# Patient Record
Sex: Female | Born: 1990
Health system: Southern US, Community
[De-identification: ages and names within clinical notes are randomized; demographics above are authoritative.]

## PROBLEM LIST (undated history)

## (undated) DIAGNOSIS — N83201 Unspecified ovarian cyst, right side: Secondary | ICD-10-CM

## (undated) DIAGNOSIS — I7300 Raynaud's syndrome without gangrene: Secondary | ICD-10-CM

## (undated) DIAGNOSIS — U099 Post covid-19 condition, unspecified: Secondary | ICD-10-CM

## (undated) DIAGNOSIS — J309 Allergic rhinitis, unspecified: Secondary | ICD-10-CM

## (undated) DIAGNOSIS — N83202 Unspecified ovarian cyst, left side: Secondary | ICD-10-CM

## (undated) DIAGNOSIS — E785 Hyperlipidemia, unspecified: Secondary | ICD-10-CM

## (undated) HISTORY — PX: LASER ABLATION OF VASCULAR LESION: SHX1950

## (undated) HISTORY — PX: ABLATION SAPHENOUS VEIN W/ RFA: SUR11

## (undated) HISTORY — PX: ANGIOMA CAUTERY: SHX1141

## (undated) HISTORY — DX: Post covid-19 condition, unspecified: U09.9

## (undated) HISTORY — DX: Allergic rhinitis, unspecified: J30.9

## (undated) HISTORY — DX: Hyperlipidemia, unspecified: E78.5

## (undated) HISTORY — PX: BREAST BIOPSY: SHX20

## (undated) HISTORY — DX: Raynaud's syndrome without gangrene: I73.00

---

## 2009-01-01 ENCOUNTER — Encounter: Admission: RE | Admit: 2009-01-01 | Discharge: 2009-01-01 | Payer: Self-pay | Admitting: Family Medicine

## 2009-09-29 ENCOUNTER — Emergency Department (HOSPITAL_COMMUNITY): Admission: EM | Admit: 2009-09-29 | Discharge: 2009-09-29 | Payer: Self-pay | Admitting: Emergency Medicine

## 2009-11-08 ENCOUNTER — Emergency Department (HOSPITAL_COMMUNITY): Admission: EM | Admit: 2009-11-08 | Discharge: 2009-11-09 | Payer: Self-pay | Admitting: Emergency Medicine

## 2010-05-13 ENCOUNTER — Emergency Department (HOSPITAL_COMMUNITY)
Admission: EM | Admit: 2010-05-13 | Discharge: 2010-05-13 | Payer: Self-pay | Source: Home / Self Care | Admitting: Emergency Medicine

## 2010-05-14 ENCOUNTER — Encounter: Payer: Self-pay | Admitting: Sports Medicine

## 2010-05-15 ENCOUNTER — Emergency Department (HOSPITAL_COMMUNITY)
Admission: EM | Admit: 2010-05-15 | Discharge: 2010-05-15 | Payer: Self-pay | Source: Home / Self Care | Admitting: Emergency Medicine

## 2010-07-01 ENCOUNTER — Ambulatory Visit: Payer: Self-pay | Admitting: Sports Medicine

## 2010-07-01 DIAGNOSIS — M7750 Other enthesopathy of unspecified foot: Secondary | ICD-10-CM | POA: Insufficient documentation

## 2010-07-01 DIAGNOSIS — M25579 Pain in unspecified ankle and joints of unspecified foot: Secondary | ICD-10-CM | POA: Insufficient documentation

## 2010-07-29 ENCOUNTER — Ambulatory Visit: Payer: Self-pay | Admitting: Sports Medicine

## 2010-09-21 ENCOUNTER — Ambulatory Visit
Admission: RE | Admit: 2010-09-21 | Discharge: 2010-09-21 | Payer: Self-pay | Source: Home / Self Care | Attending: Sports Medicine | Admitting: Sports Medicine

## 2010-09-28 NOTE — Assessment & Plan Note (Signed)
Summary: NP,U/S PERONEAL TENDON,MC   Vital Signs:  Patient profile:   20 year old female Height:      58 inches Weight:      110 pounds BMI:     23.07 BP sitting:   115 / 85  Vitals Entered By: Rochele Pages RN (July 01, 2010 3:10 PM)  History of Present Illness: Pt presents today as a referral from Dr. Margaretha Sheffield with [facility].  She is here today for further assesment of her R ankle.  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  She did not have pain later that night or the next day but did start noticing the pain the 2nd day after her injury.  She did not report any trauma and has never had prior ankle or knee injuries.  The pain is aching and tight, with occasional foot numbness and tingling located mainly on the dorsal lateral aspect of the foot.    She has recieved Mobic and ankle strengthing/stretching exercises along with a lace up ankle brace but has not had complete resolution of her pain.  She uses ice on occasion when her pain is severe.  Any extended periods of time on her feet or walking worsens her pain and swelling.    Physical Exam  General:  Well-developed,well-nourished,in no acute distress; alert,appropriate and cooperative throughout examination Msk:  Foot/Ankle: Pain with eversion of R foot.  Strength 5+/5 diffusely in B feet other than with R eversion limited by pain.  ROM equal B and WNL.  Sensation in tact to light touch.    Swelling around R posterior lateral malleolus.  TTP along R peroneal muscles.  Decreased subtalar motion R>L; moderate longitudinal arch with R collapse during wt bearing.     U/S Findings: Tear in the peroneous brevis tendon just proximal to the lateral malleolus.  Peroneal retinaculum intact.  This is a mid substance split Peroneus longus interposes at one point to give a thickness of 0.58 (2x norm) mild edema Extremities:   No clubbing, cyanosis, or edema.  She does have mild swelling posterior to the right lateral malleolus.     Impression & Recommendations:  Problem # 1:  ANKLE PAIN, RIGHT (ICD-719.47)  This is chronic w no sign of ligament instabiity TTP primarily over peroneal tendons  Orders: [place] LIMITED ([id number])  Problem # 2:  OTHER ENTHESOPATHY OF ANKLE AND TARSUS (ICD-726.79)  will begin NTG protocol  we will  order a small size ankle sport support that will  keep this area stabilized  susepct will  take 12 wks to heal  reck 1 mo  Orders: [place] LIMITED ([id number])  Complete Medication List: 1)  Nitroglycerin 0.2 Mg/hr Pt24 (Nitroglycerin) .... Apply 1/4 patch to affected area daily. change patch daily. Prescriptions: NITROGLYCERIN 0.2 MG/HR PT24 (NITROGLYCERIN) Apply 1/4 patch to affected area daily. Change patch daily.  #30 x 1   Entered by:   Rochele Pages RN   Authorized by:   Enid Baas MD   Signed by:   Rochele Pages RN on 07/01/2010   Method used:   Electronically to        [id number] (retail)       [address]       [place], [place]  [address]       Ph: [phone] or [phone]       Fax: [phone]  RxID:   [id number]    Orders Added: 1)  New Patient Level III [99203] 2)  [place] LIMITED [[id number]]  Appended Document: NP,U/S PERONEAL TENDON,MC Pt picked up airsport brace 07/08/10. Rochele Pages RN  July 09, 2010 8:45 AM

## 2010-09-28 NOTE — Consult Note (Signed)
Summary: [facility]  [facility]   Imported By: Marily Memos 05/17/2010 11:53:07  _____________________________________________________________________  External Attachment:    Type:   Image     Comment:   External Document

## 2010-09-28 NOTE — Assessment & Plan Note (Signed)
Summary: f/u,mc   Vital Signs:  Patient profile:   20 year old female BP sitting:   106 / 67  Vitals Entered By: Lillia Pauls CMA (July 29, 2010 3:43 PM)  History of Present Illness: 66 YOF w/ PMHx/o peroneal tendon rupture here for followup visit. Pt was originally referred by Dr. Margaretha Sheffield for R ankle pain. Pt was noted to have tear in the peroneous brevis tendon just proximal to the lateral malleolus on most recent ultrasound 11/311. Pt was prescribed air sport brace for ankle support as well as NTG patch. Pt reports marked improvment in pain. Especially with ankle brace. Pt also feels that ROM is also improved. Pain noted in the dorsal/lateral aspect of the foot on previous visit has also improved.   Pt now complains of pain on medial aspect of foot. Pt feels that arches may be collapsing. Has been told in the past that she had collapsing arches/flat feet. Sometimes has pain w/ foot eversion bilaterally though R>L.   Physical Exam  General:  alert and well-developed.   Msk:  Foot/Ankle: Pain with eversion of R foot.  Strength 5+/5 diffusely in B feet other than with R eversion limited by pain.  ROM equal B and WNL.  Sensation in tact to light touch.   Minimal swelling noted along lateral malleolus   noted pronation w/ standing as well as internal arch collapse + pronation w/ walking noted on exam   U/S Findings: Improved tear in the peroneous brevis tendon just inferior to the lateral malleolus.  Peroneal retinaculum intact.  This is a mid substance split Peroneus longus interposes at one point to give a thickness of 0.55  mild edema persists   Impression & Recommendations:  Problem # 1:  OTHER ENTHESOPATHY OF ANKLE AND TARSUS (ICD-726.79) Peroneal tear much improved on ultrasound. Plan to continue w/ NTG and ankle bracing. Will followup in 6-8 weeks. Will also give greensoles and scaphoid insert for arch support.  Orders: Sports Insoles [phone]) Foot Orthosis ( Arch  Strap/Heel Cup) [phone]) [place] LIMITED [phone])  Complete Medication List: 1)  Nitroglycerin 0.2 Mg/hr Pt24 (Nitroglycerin) .... Apply 1/4 patch to affected area daily. change patch daily.   Orders Added: 1)  Sports Insoles [L3510] 2)  Foot Orthosis ( Arch Strap/Heel Cup) [H8469] 3)  Est. Patient Level III [62952] 4)  [place] LIMITED [[id number]]

## 2010-09-30 NOTE — Assessment & Plan Note (Signed)
Summary: FU ANKLE/MJD   Vital Signs:  Patient profile:   20 year old female Pulse rate:   77 / minute BP sitting:   115 / 68  (right arm)  Vitals Entered By: Rochele Pages RN (September 21, 2010 11:52 AM) CC: f/ R ankle   CC:  f/ R ankle.  History of Present Illness: Pt here to f/u on Rt peroneal tendon split.  Pt was noted to have tear in the peroneous brevis tendon just proximal to the lateral malleolus that was much improved on last u/s 07/29/10. Pt has been using air sport brace ankle brace, NTG patch and sports insoles which have been helpful.  Pt states her pain is greatly improved.   Preventive Screening-Counseling & Management  Alcohol-Tobacco     Smoking Status: never  Social History: Smoking Status:  never  Physical Exam  General:  Well-developed,well-nourished,in no acute distress; alert,appropriate and cooperative throughout examination Msk:  today no difference in swelling on left or rt ankle resisted eversion on RT is now non painful ankle shows no swelling; stable lateral and medial ligaments; squeeze test and kleiger test unremarkable; talar dome seems nontender; no sign of peroneal tendon subluxations; no pain at base of 5th MT.  Additional Exam:  MSK [place]  The peroneals have decreased to 0.44 thick versus 0.58 at start of treatment there is no abnormal fluid in sheath now the area of split noted before shows some calcification but split not observed today   Impression & Recommendations:  Problem # 1:  ANKLE PAIN, RIGHT (ICD-719.47) Assessment Improved  this seems minor now  will let her use NTG for 2 more weeks and then try every other day if no increase in pain will wean off by 6 wks  Orders: [place] LIMITED ([id number])  Problem # 2:  OTHER ENTHESOPATHY OF ANKLE AND TARSUS (ICD-726.79)  definitely healing  will now add some theraband exercises and try to regain strength  reck 6 wks  Orders: [place] LIMITED ([id number])  Complete Medication List: 1)   Nitroglycerin 0.2 Mg/hr Pt24 (Nitroglycerin) .... Apply 1/4 patch to affected area daily. change patch daily.  Patient Instructions: 1)  start using theraband to do some ankle strengthening exercises 2)  Continue using nitroglycerin patch daily for the next 2 weeks, then start using patch every other day 3)  return to clinic for follow up in 6 weeks   Orders Added: 1)  Est. Patient Level III [27253] 2)  [place] LIMITED [[id number]]

## 2010-10-26 ENCOUNTER — Encounter: Payer: Self-pay | Admitting: Sports Medicine

## 2010-10-26 ENCOUNTER — Ambulatory Visit (INDEPENDENT_AMBULATORY_CARE_PROVIDER_SITE_OTHER): Payer: BC Managed Care – PPO | Admitting: Sports Medicine

## 2010-10-26 DIAGNOSIS — M25579 Pain in unspecified ankle and joints of unspecified foot: Secondary | ICD-10-CM

## 2010-10-26 DIAGNOSIS — M7750 Other enthesopathy of unspecified foot: Secondary | ICD-10-CM

## 2010-11-04 NOTE — Assessment & Plan Note (Signed)
Summary: FU ANKLE   Vital Signs:  Patient profile:   20 year old female BP sitting:   119 / 75  Vitals Entered By: Lillia Pauls CMA (October 26, 2010 12:12 PM)  History of Present Illness: Patient comes in followup of RT peroneal tendon tear and chronic tendinopathy since starting NTG she feels that she has been able to do a lot more pain is now 85% resolved able to walk on toes, dance, has sone a little easy running none of these caused any real pain or swelling  swelling has been absent for past 3 weeks in past month she has been weaning NTG patchers to q3 days or so  Physical Exam  General:  Well-developed,well-nourished,in no acute distress; alert,appropriate and cooperative throughout examination Msk:  RT ankle shows no swelling; stable lateral and medial ligaments; squeeze test and kleiger test unremarkable; talar dome seems nontender; no sign of peroneal tendon subluxations; no pain at base of 5th MT.  full testing of peroneals reveals no pain or weakness  Additional Exam:  MSK [place] The tendons continue to normalize width is only 0.38 now - which is normal no tears visualized no fluid around sheath   Impression & Recommendations:  Problem # 1:  OTHER ENTHESOPATHY OF ANKLE AND TARSUS (ICD-726.79) This is clinically and on scanning normal to exam and appearance  can stop NTG  work motion exercises and gait  to strengthen tendons  reck prn  Problem # 2:  ANKLE PAIN, RIGHT (ICD-719.47) now 85% gone use OTC meds if needed otherwise OK to go without brace etc  Complete Medication List: 1)  Nitroglycerin 0.2 Mg/hr Pt24 (Nitroglycerin) .... Apply 1/4 patch to affected area daily. change patch daily.   Orders Added: 1)  Est. Patient Level III [04540]

## 2010-11-11 LAB — URINALYSIS, ROUTINE W REFLEX MICROSCOPIC
Leukocytes, UA: NEGATIVE
Nitrite: NEGATIVE
Specific Gravity, Urine: 1.017 (ref 1.005–1.030)
Specific Gravity, Urine: 1.017 (ref 1.005–1.030)
Urobilinogen, UA: 0.2 mg/dL (ref 0.0–1.0)
pH: 7.5 (ref 5.0–8.0)

## 2010-11-11 LAB — CBC
MCV: 94.2 fL (ref 78.0–100.0)
Platelets: 230 10*3/uL (ref 150–400)
RDW: 13 % (ref 11.5–15.5)
WBC: 7.2 10*3/uL (ref 4.0–10.5)

## 2010-11-11 LAB — URINE CULTURE
Colony Count: NO GROWTH
Culture  Setup Time: 201109150838
Culture: NO GROWTH

## 2010-11-11 LAB — POCT I-STAT, CHEM 8
Chloride: 105 mEq/L (ref 96–112)
Creatinine, Ser: 0.5 mg/dL (ref 0.4–1.2)
Glucose, Bld: 97 mg/dL (ref 70–99)
HCT: 44 % (ref 36.0–46.0)
Hemoglobin: 15 g/dL (ref 12.0–15.0)
Sodium: 140 mEq/L (ref 135–145)

## 2010-11-11 LAB — DIFFERENTIAL
Basophils Absolute: 0 10*3/uL (ref 0.0–0.1)
Basophils Relative: 0 % (ref 0–1)
Lymphocytes Relative: 32 % (ref 12–46)
Lymphs Abs: 2.3 10*3/uL (ref 0.7–4.0)
Monocytes Absolute: 0.6 10*3/uL (ref 0.1–1.0)
Neutro Abs: 4.2 10*3/uL (ref 1.7–7.7)

## 2010-11-11 LAB — URINE MICROSCOPIC-ADD ON

## 2010-11-11 LAB — WET PREP, GENITAL
Trich, Wet Prep: NONE SEEN
Yeast Wet Prep HPF POC: NONE SEEN

## 2010-11-11 LAB — GC/CHLAMYDIA PROBE AMP, GENITAL
Chlamydia, DNA Probe: NEGATIVE
GC Probe Amp, Genital: NEGATIVE

## 2010-11-18 LAB — POCT PREGNANCY, URINE: Preg Test, Ur: NEGATIVE

## 2010-11-22 LAB — DIFFERENTIAL
Basophils Absolute: 0 10*3/uL (ref 0.0–0.1)
Eosinophils Absolute: 0 10*3/uL (ref 0.0–0.7)
Eosinophils Relative: 0 % (ref 0–5)
Monocytes Absolute: 0.3 10*3/uL (ref 0.1–1.0)
Neutro Abs: 9.6 10*3/uL — ABNORMAL HIGH (ref 1.7–7.7)

## 2010-11-22 LAB — POCT I-STAT, CHEM 8
BUN: 9 mg/dL (ref 6–23)
HCT: 41 % (ref 36.0–46.0)
Hemoglobin: 13.9 g/dL (ref 12.0–15.0)
Potassium: 3.2 mEq/L — ABNORMAL LOW (ref 3.5–5.1)
Sodium: 137 mEq/L (ref 135–145)
TCO2: 21 mmol/L (ref 0–100)

## 2010-11-22 LAB — URINALYSIS, ROUTINE W REFLEX MICROSCOPIC
Bilirubin Urine: NEGATIVE
Ketones, ur: >80 mg/dL — AB
Protein, ur: NEGATIVE mg/dL
pH: 8 (ref 5.0–8.0)

## 2010-11-22 LAB — CBC
Hemoglobin: 13.4 g/dL (ref 12.0–15.0)
MCHC: 32.9 g/dL (ref 30.0–36.0)
Platelets: 176 10*3/uL (ref 150–400)
RBC: 4.25 MIL/uL (ref 3.87–5.11)
RDW: 12.9 % (ref 11.5–15.5)

## 2010-11-22 LAB — URINE MICROSCOPIC-ADD ON

## 2012-01-31 ENCOUNTER — Ambulatory Visit (INDEPENDENT_AMBULATORY_CARE_PROVIDER_SITE_OTHER): Payer: BC Managed Care – PPO | Admitting: Sports Medicine

## 2012-01-31 ENCOUNTER — Encounter: Payer: Self-pay | Admitting: Sports Medicine

## 2012-01-31 VITALS — BP 116/82 | HR 79 | Ht 58.5 in | Wt 129.0 lb

## 2012-01-31 DIAGNOSIS — M25569 Pain in unspecified knee: Secondary | ICD-10-CM

## 2012-01-31 DIAGNOSIS — M2391 Unspecified internal derangement of right knee: Secondary | ICD-10-CM | POA: Insufficient documentation

## 2012-01-31 NOTE — Progress Notes (Signed)
  Subjective:    Patient ID: Savannah Beck, female    DOB: 10-03-1990, 21 y.o.   MRN: [id number]  HPI Right knee pain: X 2 months. Pain in lateral and medial knee- occurs after activity- walking, dance, etc.  Motrin and nitropatch have improved pain but has not resolved pain.  Occasional slight swelling in knee- no redness.  Feels weak- like it might "give out".  No popping or clicking.  Has h/o right peroneal tear in 07/2010. No known trauma to area.  Normal sensation.  Normal strength.   Knee pain starts on the outside near the fibular head and radiates down her peroneal muscles. This is worse with prolonged standing. She cannot date a specific injury.   Review of Systems As per above.     Objective:   Physical Exam  Constitutional: She appears well-developed and well-nourished. No distress.  Cardiovascular: Normal rate.   Pulmonary/Chest: Effort normal. No respiratory distress.  Musculoskeletal:       Right knee exam: No redness or swelling or joint effusion. ROM WNL.   Sensation normal in legs bilateral. Strength 5/5 bilateral in lower extremities- but weakness in right hip abductors compared to left.  Reflexes normal bilateral.  Mild tenderness with palpation of medial and lateral tibial plateau.   Knee ligaments stable- with end points equal bilateral- some mild hypermobility in knee ligaments bilateral.  Abnormal medial directed movement of right patella with ambulation. + pronation of feet on examination of gait.    thessaly knee test negative.           Assessment & Plan:

## 2012-01-31 NOTE — Assessment & Plan Note (Signed)
Gave pt bodyhelix knee brace to use with activity and as needed.  Gave small scaphoid pad inserts for shoes to help decrease foot pronation.  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

## 2014-01-13 ENCOUNTER — Emergency Department (HOSPITAL_COMMUNITY)
Admission: EM | Admit: 2014-01-13 | Discharge: 2014-01-13 | Disposition: A | Discharge status: Home / Self Care | Payer: BC Managed Care – PPO | Attending: Emergency Medicine | Admitting: Emergency Medicine

## 2014-01-13 ENCOUNTER — Encounter (HOSPITAL_COMMUNITY): Payer: Self-pay | Admitting: Emergency Medicine

## 2014-01-13 DIAGNOSIS — M7989 Other specified soft tissue disorders: Secondary | ICD-10-CM

## 2014-01-13 DIAGNOSIS — Z79899 Other long term (current) drug therapy: Secondary | ICD-10-CM | POA: Insufficient documentation

## 2014-01-13 DIAGNOSIS — Z880 Allergy status to penicillin: Secondary | ICD-10-CM | POA: Insufficient documentation

## 2014-01-13 LAB — D-DIMER, QUANTITATIVE: D-Dimer, Quant: <0.27 ug/mL-FEU (ref 0.00–0.48)

## 2014-01-13 MED ORDER — IBUPROFEN 600 MG PO TABS
600.0000 mg | ORAL_TABLET | Freq: Four times a day (QID) | ORAL | Status: DC | PRN
Start: 2014-01-13 — End: 2016-09-20

## 2014-01-13 MED ORDER — IBUPROFEN 200 MG PO TABS
600.0000 mg | ORAL_TABLET | Freq: Once | ORAL | Status: AC
  Administered 2014-01-13: 600 mg via ORAL
  Filled 2014-01-13: qty 3

## 2014-01-13 NOTE — ED Notes (Signed)
Pt states Friday her entire left leg was swollen and she had it massaged til the swelling went down  Saturday it was sore but felt better  Sunday she had muscle cramping but was on her feet all day  This woke up with pain shooting up her leg causing her to be unable to sleep and has swelling again  Pt states it was a pinching burning type of pain

## 2014-01-13 NOTE — ED Provider Notes (Signed)
CSN: [id number]     Arrival date & time 01/13/14  0524 History   First MD Initiated Contact with Patient 01/13/14 0559     Chief Complaint  Patient presents with  . Leg Pain     (Consider location/radiation/quality/duration/timing/severity/associated sxs/prior Treatment) HPI  This is a 24 are old female with no significant past medical history who presents with left leg swelling and pain. Patient reports onset of symptoms on Friday. She states that she was on her feet all day on Friday and noted left greater than right leg swelling. She also reports cramping that has been on off all weekend. She states that she was unable to sleep and that is why she came in. No history of recent surgery, hospitalization, or blood clots.  She denies any chest pain or shortness of breath. She denies any skin changes. No fevers.  History reviewed. No pertinent past medical history. History reviewed. No pertinent past surgical history. Family History  Problem Relation Age of Onset  . Thyroid disease Mother   . Diabetes Mother   . Cancer Father   . Diabetes Other   . CAD Other   . Alzheimer's disease Other    History  Substance Use Topics  . Smoking status: Never Smoker   . Smokeless tobacco: Never Used  . Alcohol Use: Yes     Comment: once a month   OB History   Grav Para Term Preterm Abortions TAB SAB Ect Mult Living                 Review of Systems  Constitutional: Negative for fever.  Respiratory: Negative for chest tightness and shortness of breath.   Cardiovascular: Positive for leg swelling. Negative for chest pain.  All other systems reviewed and are negative.     Allergies  Penicillins  Home Medications   Prior to Admission medications   Medication Sig Start Date End Date Taking? Authorizing Provider  TRI-PREVIFEM 0.18/0.215/0.25 MG-35 MCG tablet Take 1 tablet by mouth daily. 01/14/12  Yes Historical Provider, MD  ibuprofen (ADVIL,MOTRIN) 600 MG tablet Take 1 tablet (600 mg  total) by mouth every 6 (six) hours as needed. 01/13/14   Merryl Hacker, MD   BP 120/70  Pulse 63  Temp(Src) 99 F (37.2 C) (Oral)  Resp 16  Ht 4\' 10"  (1.473 m)  Wt 140 lb (63.504 kg)  BMI 29.27 kg/m2  SpO2 100%  LMP 12/23/2013 Physical Exam  Nursing note and vitals reviewed. Constitutional: She is oriented to person, place, and time. She appears well-developed and well-nourished.  HENT:  Head: Normocephalic and atraumatic.  Cardiovascular: Normal rate, regular rhythm and normal heart sounds.   No murmur heard. Pulmonary/Chest: Effort normal and breath sounds normal. No respiratory distress. She has no wheezes.  Musculoskeletal:  No appreciable edema of the left lower extremity, no overlying skin changes, no tenderness to palpation, negative Homans sign  Neurological: She is alert and oriented to person, place, and time.  Skin: Skin is warm and dry.  Psychiatric: She has a normal mood and affect.    ED Course  Procedures (including critical care time) Labs Review Labs Reviewed  D-DIMER, QUANTITATIVE    Imaging Review No results found.   EKG Interpretation None      MDM   Final diagnoses:  Leg swelling    Patient presents with leg swelling and pain. No known injury. Patient is on birth control so D-dimer screening for DVT. Exam is reassuring. D-dimer is negative. Discuss with  patient ibuprofen for pain management. Also encouraged patient to drink electrolyte rich fluids. Patient stated understanding.  After history, exam, and medical workup I feel the patient has been appropriately medically screened and is safe for discharge home. Pertinent diagnoses were discussed with the patient. Patient was given return precautions.     Merryl Hacker, MD 01/13/14 [phone]

## 2014-01-13 NOTE — Discharge Instructions (Signed)
Edema Edema is an abnormal build-up of fluids in tissues. Because this is partly dependent on gravity (water flows to the lowest place), it is more common in the legs and thighs (lower extremities). It is also common in the looser tissues, like around the eyes. Painless swelling of the feet and ankles is common and increases as a person ages. It may affect both legs and may include the calves or even thighs. When squeezed, the fluid may move out of the affected area and may leave a dent for a few moments. CAUSES   Prolonged standing or sitting in one place for extended periods of time. Movement helps pump tissue fluid into the veins, and absence of movement prevents this, resulting in edema.  Varicose veins. The valves in the veins do not work as well as they should. This causes fluid to leak into the tissues.  Fluid and salt overload.  Injury, burn, or surgery to the leg, ankle, or foot, may damage veins and allow fluid to leak out.  Sunburn damages vessels. Leaky vessels allow fluid to go out into the sunburned tissues.  Allergies (from insect bites or stings, medications or chemicals) cause swelling by allowing vessels to become leaky.  Protein in the blood helps keep fluid in your vessels. Low protein, as in malnutrition, allows fluid to leak out.  Hormonal changes, including pregnancy and menstruation, cause fluid retention. This fluid may leak out of vessels and cause edema.  Medications that cause fluid retention. Examples are sex hormones, blood pressure medications, steroid treatment, or anti-depressants.  Some illnesses cause edema, especially heart failure, kidney disease, or liver disease.  Surgery that cuts veins or lymph nodes, such as surgery done for the heart or for breast cancer, may result in edema. DIAGNOSIS  Your caregiver is usually easily able to determine what is causing your swelling (edema) by simply asking what is wrong (getting a history) and examining you (doing  a physical). Sometimes x-rays, EKG (electrocardiogram or heart tracing), and blood work may be done to evaluate for underlying medical illness. TREATMENT  General treatment includes:  Leg elevation (or elevation of the affected body part).  Restriction of fluid intake.  Prevention of fluid overload.  Compression of the affected body part. Compression with elastic bandages or support stockings squeezes the tissues, preventing fluid from entering and forcing it back into the blood vessels.  Diuretics (also called water pills or fluid pills) pull fluid out of your body in the form of increased urination. These are effective in reducing the swelling, but can have side effects and must be used only under your caregiver's supervision. Diuretics are appropriate only for some types of edema. The specific treatment can be directed at any underlying causes discovered. Heart, liver, or kidney disease should be treated appropriately. HOME CARE INSTRUCTIONS   Elevate the legs (or affected body part) above the level of the heart, while lying down.  Avoid sitting or standing still for prolonged periods of time.  Avoid putting anything directly under the knees when lying down, and do not wear constricting clothing or garters on the upper legs.  Exercising the legs causes the fluid to work back into the veins and lymphatic channels. This may help the swelling go down.  The pressure applied by elastic bandages or support stockings can help reduce ankle swelling.  A low-salt diet may help reduce fluid retention and decrease the ankle swelling.  Take any medications exactly as prescribed. SEEK MEDICAL CARE IF:  Your edema is   not responding to recommended treatments. SEEK IMMEDIATE MEDICAL CARE IF:   You develop shortness of breath or chest pain.  You cannot breathe when you lay down; or if, while lying down, you have to get up and go to the window to get your breath.  You are having increasing  swelling without relief from treatment.  You develop a fever over 102 F (38.9 C).  You develop pain or redness in the areas that are swollen.  Tell your caregiver right away if you have gained 03 lb/1.4 kg in 1 day or 05 lb/2.3 kg in a week. MAKE SURE YOU:   Understand these instructions.  Will watch your condition.  Will get help right away if you are not doing well or get worse. Document Released: 08/15/2005 Document Revised: 02/14/2012 Document Reviewed: 04/02/2008 [employer] Patient Information 2014 [employer].  

## 2014-10-12 ENCOUNTER — Emergency Department (HOSPITAL_COMMUNITY): Payer: BLUE CROSS/BLUE SHIELD

## 2014-10-12 ENCOUNTER — Encounter (HOSPITAL_COMMUNITY): Payer: Self-pay | Admitting: Emergency Medicine

## 2014-10-12 ENCOUNTER — Emergency Department (HOSPITAL_COMMUNITY)
Admission: EM | Admit: 2014-10-12 | Discharge: 2014-10-12 | Disposition: A | Discharge status: Home / Self Care | Payer: BLUE CROSS/BLUE SHIELD | Attending: Emergency Medicine | Admitting: Emergency Medicine

## 2014-10-12 DIAGNOSIS — Z7951 Long term (current) use of inhaled steroids: Secondary | ICD-10-CM | POA: Insufficient documentation

## 2014-10-12 DIAGNOSIS — Z3202 Encounter for pregnancy test, result negative: Secondary | ICD-10-CM | POA: Insufficient documentation

## 2014-10-12 DIAGNOSIS — R102 Pelvic and perineal pain unspecified side: Secondary | ICD-10-CM

## 2014-10-12 DIAGNOSIS — Z8742 Personal history of other diseases of the female genital tract: Secondary | ICD-10-CM | POA: Diagnosis not present

## 2014-10-12 DIAGNOSIS — Z880 Allergy status to penicillin: Secondary | ICD-10-CM | POA: Diagnosis not present

## 2014-10-12 DIAGNOSIS — R1011 Right upper quadrant pain: Secondary | ICD-10-CM

## 2014-10-12 DIAGNOSIS — N390 Urinary tract infection, site not specified: Secondary | ICD-10-CM | POA: Diagnosis not present

## 2014-10-12 DIAGNOSIS — R109 Unspecified abdominal pain: Secondary | ICD-10-CM | POA: Diagnosis present

## 2014-10-12 DIAGNOSIS — Z79899 Other long term (current) drug therapy: Secondary | ICD-10-CM | POA: Insufficient documentation

## 2014-10-12 HISTORY — DX: Unspecified ovarian cyst, right side: N83.201

## 2014-10-12 HISTORY — DX: Unspecified ovarian cyst, left side: N83.202

## 2014-10-12 LAB — COMPREHENSIVE METABOLIC PANEL
ALBUMIN: 4 g/dL (ref 3.5–5.2)
ALT: 17 U/L (ref 0–35)
AST: 20 U/L (ref 0–37)
Alkaline Phosphatase: 48 U/L (ref 39–117)
Anion gap: 6 (ref 5–15)
BILIRUBIN TOTAL: 0.5 mg/dL (ref 0.3–1.2)
BUN: 8 mg/dL (ref 6–23)
CO2: 23 mmol/L (ref 19–32)
Calcium: 8.7 mg/dL (ref 8.4–10.5)
Chloride: 111 mmol/L (ref 96–112)
Creatinine, Ser: 0.69 mg/dL (ref 0.50–1.10)
GFR calc Af Amer: >90 mL/min (ref >90)
Glucose, Bld: 103 mg/dL — ABNORMAL HIGH (ref 70–99)
POTASSIUM: 3.4 mmol/L — AB (ref 3.5–5.1)
Sodium: 140 mmol/L (ref 135–145)
Total Protein: 6.8 g/dL (ref 6.0–8.3)

## 2014-10-12 LAB — URINALYSIS, ROUTINE W REFLEX MICROSCOPIC
Bilirubin Urine: NEGATIVE
GLUCOSE, UA: NEGATIVE mg/dL
Hgb urine dipstick: NEGATIVE
Ketones, ur: NEGATIVE mg/dL
Nitrite: NEGATIVE
Protein, ur: NEGATIVE mg/dL
SPECIFIC GRAVITY, URINE: 1.004 — AB (ref 1.005–1.030)
Urobilinogen, UA: 0.2 mg/dL (ref 0.0–1.0)
pH: 7 (ref 5.0–8.0)

## 2014-10-12 LAB — CBC WITH DIFFERENTIAL/PLATELET
BASOS ABS: 0 10*3/uL (ref 0.0–0.1)
Basophils Relative: 0 % (ref 0–1)
EOS ABS: 0.1 10*3/uL (ref 0.0–0.7)
Eosinophils Relative: 1 % (ref 0–5)
HCT: 42.2 % (ref 36.0–46.0)
HEMOGLOBIN: 14.1 g/dL (ref 12.0–15.0)
Lymphocytes Relative: 26 % (ref 12–46)
Lymphs Abs: 1.5 10*3/uL (ref 0.7–4.0)
MCH: 31.1 pg (ref 26.0–34.0)
MCHC: 33.4 g/dL (ref 30.0–36.0)
MCV: 93 fL (ref 78.0–100.0)
Monocytes Absolute: 0.4 10*3/uL (ref 0.1–1.0)
Monocytes Relative: 7 % (ref 3–12)
Neutro Abs: 3.8 10*3/uL (ref 1.7–7.7)
Neutrophils Relative %: 66 % (ref 43–77)
PLATELETS: 227 10*3/uL (ref 150–400)
RBC: 4.54 MIL/uL (ref 3.87–5.11)
RDW: 12.5 % (ref 11.5–15.5)
WBC: 5.8 10*3/uL (ref 4.0–10.5)

## 2014-10-12 LAB — URINE MICROSCOPIC-ADD ON

## 2014-10-12 LAB — POC URINE PREG, ED: Preg Test, Ur: NEGATIVE

## 2014-10-12 LAB — LIPASE, BLOOD: LIPASE: 26 U/L (ref 11–59)

## 2014-10-12 LAB — PREGNANCY, URINE: Preg Test, Ur: NEGATIVE

## 2014-10-12 LAB — WET PREP, GENITAL
Clue Cells Wet Prep HPF POC: NONE SEEN
Trich, Wet Prep: NONE SEEN
YEAST WET PREP: NONE SEEN

## 2014-10-12 MED ORDER — AZITHROMYCIN 250 MG PO TABS
1000.0000 mg | ORAL_TABLET | Freq: Once | ORAL | Status: AC
  Administered 2014-10-12: 1000 mg via ORAL
  Filled 2014-10-12: qty 4

## 2014-10-12 MED ORDER — LIDOCAINE HCL (PF) 1 % IJ SOLN
INTRAMUSCULAR | Status: AC
Start: 2014-10-12 — End: 2014-10-12
  Administered 2014-10-12: 5 mL
  Filled 2014-10-12: qty 5

## 2014-10-12 MED ORDER — HYDROCODONE-ACETAMINOPHEN 5-325 MG PO TABS: 2.0000 | ORAL_TABLET | ORAL | Status: DC | PRN

## 2014-10-12 MED ORDER — FENTANYL CITRATE 0.05 MG/ML IJ SOLN
25.0000 ug | Freq: Once | INTRAMUSCULAR | Status: AC
  Administered 2014-10-12: 25 ug via INTRAVENOUS
  Filled 2014-10-12: qty 2

## 2014-10-12 MED ORDER — IOHEXOL 300 MG/ML  SOLN
100.0000 mL | Freq: Once | INTRAMUSCULAR | Status: AC | PRN
  Administered 2014-10-12: 100 mL via INTRAVENOUS

## 2014-10-12 MED ORDER — ONDANSETRON HCL 4 MG/2ML IJ SOLN
4.0000 mg | Freq: Once | INTRAMUSCULAR | Status: AC
  Administered 2014-10-12: 4 mg via INTRAVENOUS
  Filled 2014-10-12: qty 2

## 2014-10-12 MED ORDER — CEFTRIAXONE SODIUM 250 MG IJ SOLR
250.0000 mg | Freq: Once | INTRAMUSCULAR | Status: AC
  Administered 2014-10-12: 250 mg via INTRAMUSCULAR
  Filled 2014-10-12: qty 250

## 2014-10-12 MED ORDER — ONDANSETRON HCL 4 MG PO TABS: 4.0000 mg | ORAL_TABLET | Freq: Four times a day (QID) | ORAL | Status: DC

## 2014-10-12 MED ORDER — SODIUM CHLORIDE 0.9 % IV SOLN
Freq: Once | INTRAVENOUS | Status: AC
  Administered 2014-10-12: 09:00:00 via INTRAVENOUS

## 2014-10-12 MED ORDER — CEPHALEXIN 500 MG PO CAPS: 500.0000 mg | ORAL_CAPSULE | Freq: Four times a day (QID) | ORAL | Status: DC

## 2014-10-12 MED ORDER — IOHEXOL 300 MG/ML  SOLN
50.0000 mL | Freq: Once | INTRAMUSCULAR | Status: AC | PRN
  Administered 2014-10-12: 50 mL via ORAL

## 2014-10-12 MED ORDER — MORPHINE SULFATE 4 MG/ML IJ SOLN
4.0000 mg | Freq: Once | INTRAMUSCULAR | Status: AC
  Administered 2014-10-12: 4 mg via INTRAVENOUS
  Filled 2014-10-12: qty 1

## 2014-10-12 NOTE — ED Notes (Signed)
Pt undressed and ready for pelvic exam.

## 2014-10-12 NOTE — ED Provider Notes (Signed)
CSN: [id number]     Arrival date & time 10/12/14  2671 History   First MD Initiated Contact with Patient 10/12/14 [phone]     Chief Complaint  Patient presents with  . Abdominal Pain     (Consider location/radiation/quality/duration/timing/severity/associated sxs/prior Treatment) HPI Comments: Constant right-sided abdominal pain since last night worse with palpation and movement. Associated with nausea. No vomiting. No change in bowel habits. Endorses urinary frequency and burning with urination. No vaginal bleeding or discharge. Last menstrual period one month ago. History of ovarian cysts but this feels different and more sharp. No appetite. No back pain. No chest pain or shortness of breath.  The history is provided by the patient.    Past Medical History  Diagnosis Date  . Bilateral ovarian cysts    History reviewed. No pertinent past surgical history. Family History  Problem Relation Age of Onset  . Thyroid disease Mother   . Diabetes Mother   . Cancer Father   . Diabetes Other   . CAD Other   . Alzheimer's disease Other    History  Substance Use Topics  . Smoking status: Never Smoker   . Smokeless tobacco: Never Used  . Alcohol Use: Yes     Comment: once a month   OB History    No data available     Review of Systems  Constitutional: Positive for appetite change. Negative for fever and activity change.  Respiratory: Negative for cough, chest tightness and shortness of breath.   Gastrointestinal: Positive for nausea and abdominal pain. Negative for vomiting and diarrhea.  Genitourinary: Positive for frequency. Negative for dysuria, vaginal bleeding and vaginal discharge.  Musculoskeletal: Negative for myalgias, back pain and arthralgias.  Skin: Negative for rash.  Neurological: Negative for dizziness, weakness and headaches.  A complete 10 system review of systems was obtained and all systems are negative except as noted in the HPI and PMH.      Allergies   Penicillins  Home Medications   Prior to Admission medications   Medication Sig Start Date End Date Taking? Authorizing Provider  cetirizine (ZYRTEC) 10 MG tablet Take 10 mg by mouth daily.   Yes Historical Provider, MD  fluticasone (FLONASE) 50 MCG/ACT nasal spray Place 1 spray into both nostrils daily.   Yes Historical Provider, MD  TRI-PREVIFEM 0.18/0.215/0.25 MG-35 MCG tablet Take 1 tablet by mouth daily. 01/14/12  Yes Historical Provider, MD  cephALEXin (KEFLEX) 500 MG capsule Take 1 capsule (500 mg total) by mouth 4 (four) times daily. 10/12/14   Ezequiel Essex, MD  HYDROcodone-acetaminophen (NORCO/VICODIN) 5-325 MG per tablet Take 2 tablets by mouth every 4 (four) hours as needed. 10/12/14   Ezequiel Essex, MD  ibuprofen (ADVIL,MOTRIN) 600 MG tablet Take 1 tablet (600 mg total) by mouth every 6 (six) hours as needed. Patient not taking: Reported on 10/12/2014 01/13/14   Merryl Hacker, MD  ondansetron (ZOFRAN) 4 MG tablet Take 1 tablet (4 mg total) by mouth every 6 (six) hours. 10/12/14   Ezequiel Essex, MD   BP 128/79 mmHg  Pulse 81  Temp(Src) 97.4 F (36.3 C) (Oral)  Resp 14  SpO2 97%  LMP 09/27/2014 Physical Exam  Constitutional: She is oriented to person, place, and time. She appears well-developed and well-nourished. No distress.  uncomfortable  HENT:  Head: Normocephalic and atraumatic.  Mouth/Throat: Oropharynx is clear and moist. No oropharyngeal exudate.  Eyes: Conjunctivae and EOM are normal. Pupils are equal, round, and reactive to light.  Neck: Normal range of  motion. Neck supple.  No meningismus.  Cardiovascular: Normal rate, regular rhythm, normal heart sounds and intact distal pulses.   No murmur heard. Pulmonary/Chest: Effort normal and breath sounds normal. No respiratory distress.  Abdominal: Soft. There is tenderness. There is guarding. There is no rebound.  Diffuse right-sided abdominal tenderness, worse in the right upper quadrant with guarding   Genitourinary: Vaginal discharge found.  Chaperone present. Normal external genitalia. White vaginal discharge in vault. No CMT. No uterine tenderness. No adnexal tenderness  Musculoskeletal: Normal range of motion. She exhibits no edema or tenderness.  No CVAT  Neurological: She is alert and oriented to person, place, and time. No cranial nerve deficit. She exhibits normal muscle tone. Coordination normal.  No ataxia on finger to nose bilaterally. No pronator drift. 5/5 strength throughout. CN 2-12 intact. Negative Romberg. Equal grip strength. Sensation intact. Gait is normal.   Skin: Skin is warm.  Psychiatric: She has a normal mood and affect. Her behavior is normal.  Nursing note and vitals reviewed.   ED Course  Procedures (including critical care time) Labs Review Labs Reviewed  WET PREP, GENITAL - Abnormal; Notable for the following:    WBC, Wet Prep HPF POC TOO NUMEROUS TO COUNT (*)    All other components within normal limits  COMPREHENSIVE METABOLIC PANEL - Abnormal; Notable for the following:    Potassium 3.4 (*)    Glucose, Bld 103 (*)    All other components within normal limits  URINALYSIS, ROUTINE W REFLEX MICROSCOPIC - Abnormal; Notable for the following:    APPearance CLOUDY (*)    Specific Gravity, Urine 1.004 (*)    Leukocytes, UA LARGE (*)    All other components within normal limits  URINE MICROSCOPIC-ADD ON - Abnormal; Notable for the following:    Squamous Epithelial / LPF MANY (*)    All other components within normal limits  URINE CULTURE  CBC WITH DIFFERENTIAL/PLATELET  LIPASE, BLOOD  PREGNANCY, URINE  POC URINE PREG, ED  GC/CHLAMYDIA PROBE AMP ([place])    Imaging Review US Transvaginal Non-ob  10/12/2014   CLINICAL DATA:  Right pelvic pain for 12 hours.  EXAM: TRANSABDOMINAL AND TRANSVAGINAL ULTRASOUND OF PELVIS  DOPPLER ULTRASOUND OF OVARIES  TECHNIQUE: Both transabdominal and transvaginal ultrasound examinations of the pelvis were  performed. Transabdominal technique was performed for global imaging of the pelvis including uterus, ovaries, adnexal regions, and pelvic cul-de-sac.  It was necessary to proceed with endovaginal exam following the transabdominal exam to visualize the adnexal structures. Color and duplex Doppler ultrasound was utilized to evaluate blood flow to the ovaries.  COMPARISON:  CT 05/15/2010  FINDINGS: Uterus  Measurements: 8.2 x 3.2 x 4.5 cm. No fibroids or other mass visualized.  Endometrium  Thickness: 5 mm.  No focal abnormality visualized.  Right ovary  Measurements: 3.2 x 1.7 x 2.0 cm. Normal appearance/no adnexal mass.  Left ovary  Measurements: 2.9 x 1.5 x 1.9 cm. Normal appearance/no adnexal mass.  Pulsed Doppler evaluation of both ovaries demonstrates normal low-resistance arterial and venous waveforms.  Other findings  Trace free fluid in the pelvis.  IMPRESSION: No sonographic evidence to suggest ovarian torsion. The ovaries demonstrate flow and a normal sonographic grayscale appearance.  Trace pelvic free fluid.   Electronically Signed   By: Lovey Newcomer M.D.   On: 10/12/2014 12:16   US Pelvis Complete  10/12/2014   CLINICAL DATA:  Right pelvic pain for 12 hours.  EXAM: TRANSABDOMINAL AND TRANSVAGINAL ULTRASOUND OF PELVIS  DOPPLER  ULTRASOUND OF OVARIES  TECHNIQUE: Both transabdominal and transvaginal ultrasound examinations of the pelvis were performed. Transabdominal technique was performed for global imaging of the pelvis including uterus, ovaries, adnexal regions, and pelvic cul-de-sac.  It was necessary to proceed with endovaginal exam following the transabdominal exam to visualize the adnexal structures. Color and duplex Doppler ultrasound was utilized to evaluate blood flow to the ovaries.  COMPARISON:  CT 05/15/2010  FINDINGS: Uterus  Measurements: 8.2 x 3.2 x 4.5 cm. No fibroids or other mass visualized.  Endometrium  Thickness: 5 mm.  No focal abnormality visualized.  Right ovary  Measurements: 3.2  x 1.7 x 2.0 cm. Normal appearance/no adnexal mass.  Left ovary  Measurements: 2.9 x 1.5 x 1.9 cm. Normal appearance/no adnexal mass.  Pulsed Doppler evaluation of both ovaries demonstrates normal low-resistance arterial and venous waveforms.  Other findings  Trace free fluid in the pelvis.  IMPRESSION: No sonographic evidence to suggest ovarian torsion. The ovaries demonstrate flow and a normal sonographic grayscale appearance.  Trace pelvic free fluid.   Electronically Signed   By: Lovey Newcomer M.D.   On: 10/12/2014 12:16   Ct Abdomen Pelvis W Contrast  10/12/2014   CLINICAL DATA:  Right lower abdominal pain for 12 hours. Urinary frequency and nausea.  EXAM: CT ABDOMEN AND PELVIS WITH CONTRAST  TECHNIQUE: Multidetector CT imaging of the abdomen and pelvis was performed using the standard protocol following bolus administration of intravenous contrast.  CONTRAST:  56mL OMNIPAQUE IOHEXOL 300 MG/ML SOLN, 142mL OMNIPAQUE IOHEXOL 300 MG/ML SOLN  COMPARISON:  05/15/2010  FINDINGS: Lower chest: Unremarkable.  Upper abdomen: Kidneys have a normal appearance. No hydronephrosis or focal mass. No focal abnormality identified within the liver, spleen pancreas, adrenal glands. Gallbladder is present.  Gastrointestinal tract: The stomach and small bowel loops are normal in appearance. The appendix is well seen and has a normal appearance. The colonic loops are normal in wall thickness. There is a moderate stool burden.  Pelvis: The uterus is present. No adnexal mass. No free pelvic fluid.  Retroperitoneum: No retroperitoneal or mesenteric adenopathy.  Abdominal wall: Unremarkable.  Osseous structures: Unremarkable.  IMPRESSION: 1. Normal appearance of the kidneys and ureters. 2. Normal appendix. 3. No bowel obstruction.   Electronically Signed   By: Nolon Nations M.D.   On: 10/12/2014 14:30   [place] Art/ven Flow Abd Pelv Doppler  10/12/2014   CLINICAL DATA:  Right pelvic pain for 12 hours.  EXAM: TRANSABDOMINAL AND  TRANSVAGINAL ULTRASOUND OF PELVIS  DOPPLER ULTRASOUND OF OVARIES  TECHNIQUE: Both transabdominal and transvaginal ultrasound examinations of the pelvis were performed. Transabdominal technique was performed for global imaging of the pelvis including uterus, ovaries, adnexal regions, and pelvic cul-de-sac.  It was necessary to proceed with endovaginal exam following the transabdominal exam to visualize the adnexal structures. Color and duplex Doppler ultrasound was utilized to evaluate blood flow to the ovaries.  COMPARISON:  CT 05/15/2010  FINDINGS: Uterus  Measurements: 8.2 x 3.2 x 4.5 cm. No fibroids or other mass visualized.  Endometrium  Thickness: 5 mm.  No focal abnormality visualized.  Right ovary  Measurements: 3.2 x 1.7 x 2.0 cm. Normal appearance/no adnexal mass.  Left ovary  Measurements: 2.9 x 1.5 x 1.9 cm. Normal appearance/no adnexal mass.  Pulsed Doppler evaluation of both ovaries demonstrates normal low-resistance arterial and venous waveforms.  Other findings  Trace free fluid in the pelvis.  IMPRESSION: No sonographic evidence to suggest ovarian torsion. The ovaries demonstrate flow and a normal sonographic  grayscale appearance.  Trace pelvic free fluid.   Electronically Signed   By: Lovey Newcomer M.D.   On: 10/12/2014 12:16   US Abdomen Limited Ruq  10/12/2014   CLINICAL DATA:  Right upper quadrant pain for 12 hours.  EXAM: US ABDOMEN LIMITED - RIGHT UPPER QUADRANT  COMPARISON:  None.  FINDINGS: Gallbladder:  No gallstones or wall thickening visualized. No sonographic Murphy sign noted.  Common bile duct:  Diameter: 2 mm  Liver:  No focal lesion identified. Within normal limits in parenchymal echogenicity.  IMPRESSION: No cholelithiasis or sonographic evidence for acute cholecystitis.   Electronically Signed   By: Lovey Newcomer M.D.   On: 10/12/2014 11:12     EKG Interpretation None      MDM   Final diagnoses:  RUQ pain  Pelvic pain in female  Urinary tract infection without hematuria,  site unspecified   Right-sided abdominal pain with nausea. Pain is diffuse on the right side but worse in the right upper quadrant. Also tender in the right lower quadrant.  UA possible infection but contaminated. Pelvic exam benign. Treat possible cervicitis. No evidence of PID. Doubt ovarian torsion.  Given ongoing RUQ pain US obtained.  LFTs and lipase normal. Gallbladder normal.  No evidence of ovarian cyst or torsion. Normal appendix.  After extensive workup, no convincing etiology for patient's severe abdominal pain other than possible UTI. Treat for UTI and cervicitis, follow up with PCP, return precautions discussed.   Ezequiel Essex, MD 10/13/14 [phone]

## 2014-10-12 NOTE — ED Notes (Signed)
Spoke with Ronny Bacon to confirm blood is in lab for Lipase

## 2014-10-12 NOTE — ED Notes (Signed)
Pt in restroom 

## 2014-10-12 NOTE — ED Notes (Signed)
Pt ambulated to restroom. [place] at bedside and will be completed upon patients arrival.

## 2014-10-12 NOTE — ED Notes (Signed)
Pt returned from CT °

## 2014-10-12 NOTE — Discharge Instructions (Signed)
Abdominal Pain Your gallblladder, appendix, and ovaries appear normal. Take the antibiotics for a urine infection. Follow up with your doctor. Return to the ED if you develop new or worsening symptoms. Many things can cause abdominal pain. Usually, abdominal pain is not caused by a disease and will improve without treatment. It can often be observed and treated at home. Your health care provider will do a physical exam and possibly order blood tests and X-rays to help determine the seriousness of your pain. However, in many cases, more time must pass before a clear cause of the pain can be found. Before that point, your health care provider may not know if you need more testing or further treatment. HOME CARE INSTRUCTIONS  Monitor your abdominal pain for any changes. The following actions may help to alleviate any discomfort you are experiencing:  Only take over-the-counter or prescription medicines as directed by your health care provider.  Do not take laxatives unless directed to do so by your health care provider.  Try a clear liquid diet (broth, tea, or water) as directed by your health care provider. Slowly move to a bland diet as tolerated. SEEK MEDICAL CARE IF:  You have unexplained abdominal pain.  You have abdominal pain associated with nausea or diarrhea.  You have pain when you urinate or have a bowel movement.  You experience abdominal pain that wakes you in the night.  You have abdominal pain that is worsened or improved by eating food.  You have abdominal pain that is worsened with eating fatty foods.  You have a fever. SEEK IMMEDIATE MEDICAL CARE IF:   Your pain does not go away within 2 hours.  You keep throwing up (vomiting).  Your pain is felt only in portions of the abdomen, such as the right side or the left lower portion of the abdomen.  You pass bloody or black tarry stools. MAKE SURE YOU:  Understand these instructions.   Will watch your condition.    Will get help right away if you are not doing well or get worse.  Document Released: 05/25/2005 Document Revised: 08/20/2013 Document Reviewed: 04/24/2013 [facility] Patient Information 2015 [place], [place]. This information is not intended to replace advice given to you by your health care provider. Make sure you discuss any questions you have with your health care provider.  Urinary Tract Infection Urinary tract infections (UTIs) can develop anywhere along your urinary tract. Your urinary tract is your body's drainage system for removing wastes and extra water. Your urinary tract includes two kidneys, two ureters, a bladder, and a urethra. Your kidneys are a pair of bean-shaped organs. Each kidney is about the size of your fist. They are located below your ribs, one on each side of your spine. CAUSES Infections are caused by microbes, which are microscopic organisms, including fungi, viruses, and bacteria. These organisms are so small that they can only be seen through a microscope. Bacteria are the microbes that most commonly cause UTIs. SYMPTOMS  Symptoms of UTIs may vary by age and gender of the patient and by the location of the infection. Symptoms in young women typically include a frequent and intense urge to urinate and a painful, burning feeling in the bladder or urethra during urination. Older women and men are more likely to be tired, shaky, and weak and have muscle aches and abdominal pain. A fever may mean the infection is in your kidneys. Other symptoms of a kidney infection include pain in your back or sides below  the ribs, nausea, and vomiting. DIAGNOSIS To diagnose a UTI, your caregiver will ask you about your symptoms. Your caregiver also will ask to provide a urine sample. The urine sample will be tested for bacteria and white blood cells. White blood cells are made by your body to help fight infection. TREATMENT  Typically, UTIs can be treated with medication. Because most UTIs  are caused by a bacterial infection, they usually can be treated with the use of antibiotics. The choice of antibiotic and length of treatment depend on your symptoms and the type of bacteria causing your infection. HOME CARE INSTRUCTIONS  If you were prescribed antibiotics, take them exactly as your caregiver instructs you. Finish the medication even if you feel better after you have only taken some of the medication.  Drink enough water and fluids to keep your urine clear or pale yellow.  Avoid caffeine, tea, and carbonated beverages. They tend to irritate your bladder.  Empty your bladder often. Avoid holding urine for long periods of time.  Empty your bladder before and after sexual intercourse.  After a bowel movement, women should cleanse from front to back. Use each tissue only once. SEEK MEDICAL CARE IF:   You have back pain.  You develop a fever.  Your symptoms do not begin to resolve within 3 days. SEEK IMMEDIATE MEDICAL CARE IF:   You have severe back pain or lower abdominal pain.  You develop chills.  You have nausea or vomiting.  You have continued burning or discomfort with urination. MAKE SURE YOU:   Understand these instructions.  Will watch your condition.  Will get help right away if you are not doing well or get worse. Document Released: 05/25/2005 Document Revised: 02/14/2012 Document Reviewed: 09/23/2011 [facility] Patient Information 2015 [place], [place]. This information is not intended to replace advice given to you by your health care provider. Make sure you discuss any questions you have with your health care provider.

## 2014-10-12 NOTE — ED Notes (Signed)
Per pt, states right lower abdominal pain for about 12 hours-urine frequency and nausea

## 2014-10-13 LAB — GC/CHLAMYDIA PROBE AMP (~~LOC~~) NOT AT ARMC
Chlamydia: NEGATIVE
NEISSERIA GONORRHEA: NEGATIVE

## 2014-10-14 LAB — URINE CULTURE: Colony Count: 60000

## 2015-10-19 ENCOUNTER — Other Ambulatory Visit: Payer: Self-pay

## 2015-10-19 DIAGNOSIS — M7989 Other specified soft tissue disorders: Secondary | ICD-10-CM

## 2015-12-09 ENCOUNTER — Encounter: Payer: Self-pay | Admitting: Vascular Surgery

## 2015-12-16 ENCOUNTER — Encounter: Payer: Self-pay | Admitting: Vascular Surgery

## 2015-12-16 ENCOUNTER — Ambulatory Visit (INDEPENDENT_AMBULATORY_CARE_PROVIDER_SITE_OTHER): Payer: BLUE CROSS/BLUE SHIELD | Admitting: Vascular Surgery

## 2015-12-16 ENCOUNTER — Ambulatory Visit (HOSPITAL_COMMUNITY)
Admission: RE | Admit: 2015-12-16 | Discharge: 2015-12-16 | Disposition: A | Discharge status: Home / Self Care | Payer: BLUE CROSS/BLUE SHIELD | Source: Ambulatory Visit | Attending: Vascular Surgery | Admitting: Vascular Surgery

## 2015-12-16 VITALS — BP 133/91 | HR 90 | Temp 99.9°F | Resp 14 | Ht <= 58 in | Wt 150.0 lb

## 2015-12-16 DIAGNOSIS — M7989 Other specified soft tissue disorders: Secondary | ICD-10-CM | POA: Diagnosis not present

## 2015-12-16 DIAGNOSIS — I872 Venous insufficiency (chronic) (peripheral): Secondary | ICD-10-CM | POA: Diagnosis not present

## 2015-12-16 DIAGNOSIS — I8393 Asymptomatic varicose veins of bilateral lower extremities: Secondary | ICD-10-CM | POA: Insufficient documentation

## 2015-12-16 DIAGNOSIS — R609 Edema, unspecified: Secondary | ICD-10-CM | POA: Diagnosis present

## 2015-12-16 NOTE — Progress Notes (Signed)
Vascular and Vein Specialist of [facility]  Patient name: Savannah Beck MRN: [id number] DOB: Sep 09, 1990 Sex: female  REASON FOR CONSULT: Bilateral lower extremity pain and swelling. Referred by Dr. Chapman Fitch  HPI: Savannah Beck is a 25 y.o. female, who has been having swelling in both lower extremities for approximately 2 years. Her symptoms have been more significant on the left side. She denies any previous history of DVT or phlebitis. She's had no injuries to the left leg. She has sprained her right knee and right ankle several times. She experiences some aching pain in both legs which is associated with prolonged sitting and standing. Her symptoms are relieved with elevation. Her swelling is relieved with elevation. She denies any previous abdominal surgery, inguinal surgery, or radiation therapy.  I have reviewed the records that were sent with the patient from Dr. Siri Cole office. She had been having some lower extremity swelling which is more significant on this left side which prompted the referral to our office.  Past Medical History  Diagnosis Date  . Bilateral ovarian cysts     Family History  Problem Relation Age of Onset  . Thyroid disease Mother   . Diabetes Mother   . Cancer Father   . Diabetes Other   . CAD Other   . Alzheimer's disease Other     SOCIAL HISTORY: Social History   Social History  . Marital Status: Single    Spouse Name: N/A  . Number of Children: N/A  . Years of Education: N/A   Occupational History  . Not on file.   Social History Main Topics  . Smoking status: Never Smoker   . Smokeless tobacco: Never Used  . Alcohol Use: 0.0 oz/week    0 Standard drinks or equivalent per week     Comment: once a month  . Drug Use: No  . Sexual Activity: Not on file   Other Topics Concern  . Not on file   Social History Narrative    Allergies  Allergen Reactions  . Penicillins Other (See Comments)    "childhood allergy"    Current Outpatient  Prescriptions  Medication Sig Dispense Refill  . cetirizine (ZYRTEC) 10 MG tablet Take 10 mg by mouth daily.    . fluticasone (FLONASE) 50 MCG/ACT nasal spray Place 1 spray into both nostrils daily.    Marland Kitchen ibuprofen (ADVIL,MOTRIN) 600 MG tablet Take 1 tablet (600 mg total) by mouth every 6 (six) hours as needed. 30 tablet 0  . cephALEXin (KEFLEX) 500 MG capsule Take 1 capsule (500 mg total) by mouth 4 (four) times daily. (Patient not taking: Reported on 12/16/2015) 40 capsule 0  . HYDROcodone-acetaminophen (NORCO/VICODIN) 5-325 MG per tablet Take 2 tablets by mouth every 4 (four) hours as needed. (Patient not taking: Reported on 12/16/2015) 10 tablet 0  . ondansetron (ZOFRAN) 4 MG tablet Take 1 tablet (4 mg total) by mouth every 6 (six) hours. (Patient not taking: Reported on 12/16/2015) 12 tablet 0  . TRI-PREVIFEM 0.18/0.215/0.25 MG-35 MCG tablet Take 1 tablet by mouth daily. Reported on 12/16/2015     No current facility-administered medications for this visit.    REVIEW OF SYSTEMS:  [X]  denotes positive finding, [ ]  denotes negative finding Cardiac  Comments:  Chest pain or chest pressure:    Shortness of breath upon exertion:    Short of breath when lying flat:    Irregular heart rhythm:        Vascular    Pain in calf, thigh, or  hip brought on by ambulation:    Pain in feet at night that wakes you up from your sleep:     Blood clot in your veins:    Leg swelling:  X       Pulmonary    Oxygen at home:    Productive cough:     Wheezing:         Neurologic    Sudden weakness in arms or legs:     Sudden numbness in arms or legs:     Sudden onset of difficulty speaking or slurred speech:    Temporary loss of vision in one eye:     Problems with dizziness:         Gastrointestinal    Blood in stool:     Vomited blood:         Genitourinary    Burning when urinating:     Blood in urine:        Psychiatric    Major depression:         Hematologic    Bleeding problems:      Problems with blood clotting too easily:        Skin    Rashes or ulcers:        Constitutional    Fever or chills:      PHYSICAL EXAM: Filed Vitals:   12/16/15 1317  BP: 133/91  Pulse: 90  Temp: 99.9 F (37.7 C)  Resp: 14  Height: 4\' 9"  (1.448 m)  Weight: 150 lb (68.04 kg)  SpO2: 100%    GENERAL: The patient is a well-nourished female, in no acute distress. The vital signs are documented above. CARDIAC: There is a regular rate and rhythm.  VASCULAR: I do not detect carotid bruits. She has palpable popliteal and pedal pulses bilaterally. She has mild bilateral lower extremity swelling which is slightly more significant on the left side. PULMONARY: There is good air exchange bilaterally without wheezing or rales. ABDOMEN: Soft and non-tender with normal pitched bowel sounds.  MUSCULOSKELETAL: There are no major deformities or cyanosis. NEUROLOGIC: No focal weakness or paresthesias are detected. SKIN: There are no ulcers or rashes noted. PSYCHIATRIC: The patient has a normal affect.  DATA:   LOWER EXTREMITY VENOUS DUPLEX: I have independently interpreted her lower extremity venous duplex scan.  On the left side, which is the more symptomatic side, there is no evidence of deep venous thrombosis or superficial thrombophlebitis. There is reflux in the common femoral vein on the left and also reflux in the left great saphenous vein.  On the right side, there is no evidence of DVT. There is reflux in the deep system on the right involving the common femoral vein, femoral vein, and popliteal vein. There is reflux in the saphenofemoral junction on the right.  MEDICAL ISSUES:  CHRONIC VENOUS INSUFFICIENCY: Based on her duplex study she does have evidence of chronic venous insufficiency bilaterally. This patient has significant chronic venous insufficiency. I've explained that this is a chronic problem. We have discussed the importance of intermittent leg elevation in the proper  positioning for this. In addition, I have discussed the importance of wearing compression stockings when they will be standing or sitting for a long time.  I have encouraged him to stay as active as possible and to avoid prolonged sitting and standing. We have also discussed water aerobics which I think is also very helpful for people with chronic venous insufficiency. Discussed the possibility that she may have some  mild lymphedema however the treatment for this would be exactly the same.  If her symptoms progressed significantly she could potentially be considered for thigh-high compression stockings with a gradient of 20-30 mmHg and at this were not successful potentially she could have laser ablation of the left great saphenous vein. Also, if her symptoms progressed significantly we could consider a CT venogram to rule out May Thurner syndrome although at this point I think that would be overly aggressive.  HYPERTENSION: The patient's initial blood pressure today was elevated. We repeated this and this was still elevated. We have encouraged the patient to follow up with their primary care physician for management of their blood pressure.   Deitra Mayo [facility] [place]: [phone]

## 2016-01-25 DIAGNOSIS — F411 Generalized anxiety disorder: Secondary | ICD-10-CM | POA: Diagnosis not present

## 2016-02-11 DIAGNOSIS — F411 Generalized anxiety disorder: Secondary | ICD-10-CM | POA: Diagnosis not present

## 2016-02-22 DIAGNOSIS — F411 Generalized anxiety disorder: Secondary | ICD-10-CM | POA: Diagnosis not present

## 2016-07-06 DIAGNOSIS — M3501 Sicca syndrome with keratoconjunctivitis: Secondary | ICD-10-CM | POA: Diagnosis not present

## 2016-07-06 DIAGNOSIS — H04123 Dry eye syndrome of bilateral lacrimal glands: Secondary | ICD-10-CM | POA: Diagnosis not present

## 2016-07-12 DIAGNOSIS — R1084 Generalized abdominal pain: Secondary | ICD-10-CM | POA: Diagnosis not present

## 2016-07-12 DIAGNOSIS — R109 Unspecified abdominal pain: Secondary | ICD-10-CM | POA: Diagnosis not present

## 2016-07-12 DIAGNOSIS — K838 Other specified diseases of biliary tract: Secondary | ICD-10-CM | POA: Diagnosis not present

## 2016-07-13 DIAGNOSIS — R109 Unspecified abdominal pain: Secondary | ICD-10-CM | POA: Diagnosis not present

## 2016-09-20 ENCOUNTER — Inpatient Hospital Stay (HOSPITAL_COMMUNITY): Payer: BLUE CROSS/BLUE SHIELD

## 2016-09-20 ENCOUNTER — Inpatient Hospital Stay (HOSPITAL_COMMUNITY)
Admission: AD | Admit: 2016-09-20 | Discharge: 2016-09-20 | Disposition: A | Discharge status: Home / Self Care | Payer: BLUE CROSS/BLUE SHIELD | Source: Ambulatory Visit | Attending: Family Medicine | Admitting: Family Medicine

## 2016-09-20 DIAGNOSIS — Z79899 Other long term (current) drug therapy: Secondary | ICD-10-CM | POA: Diagnosis not present

## 2016-09-20 DIAGNOSIS — N939 Abnormal uterine and vaginal bleeding, unspecified: Secondary | ICD-10-CM | POA: Insufficient documentation

## 2016-09-20 DIAGNOSIS — R102 Pelvic and perineal pain: Secondary | ICD-10-CM

## 2016-09-20 DIAGNOSIS — N83202 Unspecified ovarian cyst, left side: Secondary | ICD-10-CM | POA: Diagnosis not present

## 2016-09-20 DIAGNOSIS — N83201 Unspecified ovarian cyst, right side: Secondary | ICD-10-CM | POA: Insufficient documentation

## 2016-09-20 DIAGNOSIS — Z880 Allergy status to penicillin: Secondary | ICD-10-CM | POA: Diagnosis not present

## 2016-09-20 LAB — COMPREHENSIVE METABOLIC PANEL
ALT: 17 U/L (ref 14–54)
AST: 17 U/L (ref 15–41)
Albumin: 4.5 g/dL (ref 3.5–5.0)
Alkaline Phosphatase: 64 U/L (ref 38–126)
Anion gap: 7 (ref 5–15)
BUN: 13 mg/dL (ref 6–20)
CO2: 25 mmol/L (ref 22–32)
Calcium: 8.7 mg/dL — ABNORMAL LOW (ref 8.9–10.3)
Chloride: 105 mmol/L (ref 101–111)
Creatinine, Ser: 0.69 mg/dL (ref 0.44–1.00)
Glucose, Bld: 109 mg/dL — ABNORMAL HIGH (ref 65–99)
POTASSIUM: 4.3 mmol/L (ref 3.5–5.1)
Sodium: 137 mmol/L (ref 135–145)
Total Bilirubin: 1.2 mg/dL (ref 0.3–1.2)
Total Protein: 7.4 g/dL (ref 6.5–8.1)

## 2016-09-20 LAB — URINALYSIS, ROUTINE W REFLEX MICROSCOPIC

## 2016-09-20 LAB — CBC
HCT: 39.4 % (ref 36.0–46.0)
Hemoglobin: 13.7 g/dL (ref 12.0–15.0)
MCH: 31.3 pg (ref 26.0–34.0)
MCHC: 34.8 g/dL (ref 30.0–36.0)
MCV: 90 fL (ref 78.0–100.0)
Platelets: 223 10*3/uL (ref 150–400)
RBC: 4.38 MIL/uL (ref 3.87–5.11)
RDW: 13 % (ref 11.5–15.5)
WBC: 6.1 10*3/uL (ref 4.0–10.5)

## 2016-09-20 LAB — URINALYSIS, MICROSCOPIC (REFLEX)

## 2016-09-20 LAB — POCT PREGNANCY, URINE: Preg Test, Ur: NEGATIVE

## 2016-09-20 MED ORDER — KETOROLAC TROMETHAMINE 60 MG/2ML IM SOLN
60.0000 mg | Freq: Once | INTRAMUSCULAR | Status: AC
  Administered 2016-09-20: 60 mg via INTRAMUSCULAR
  Filled 2016-09-20: qty 2

## 2016-09-20 MED ORDER — IBUPROFEN 800 MG PO TABS: 800.0000 mg | ORAL_TABLET | Freq: Three times a day (TID) | ORAL | 0 refills | Status: DC | PRN

## 2016-09-20 NOTE — Discharge Instructions (Signed)

## 2016-09-20 NOTE — MAU Provider Note (Signed)
History     CSN: [id number]  Arrival date and time: 09/20/16 [id number]   First Provider Initiated Contact with Patient 09/20/16 [phone]      Chief Complaint  Patient presents with  . Pelvic Pain   Savannah Beck is a 26 y.o. Who presents today with lower abdominal pain and vaginal bleeding. She states that she has had the pain off and on since November. She has also had bleeding "mostly every day since November" as well. She was seen at UC at that time and had a 5.3cm simple cyst on [place].    Pelvic Pain  The patient's primary symptoms include pelvic pain and vaginal bleeding. This is a new problem. The current episode started more than 1 month ago. The problem occurs intermittently. The problem has been unchanged. The pain is severe. The problem affects the right side. She is not pregnant. Associated symptoms include nausea. Pertinent negatives include no chills, constipation, diarrhea, dysuria, fever, frequency, urgency or vomiting. The vaginal discharge was bloody. The vaginal bleeding is typical of menses (Since 07/12/16 ). Passing clots: one clot last week.  She has not been passing tissue. Nothing aggravates the symptoms. She has tried acetaminophen for the symptoms. The treatment provided mild relief. Contraceptive use: Nexplanon  Menstrual history: Periods were regular until November.    Past Medical History:  Diagnosis Date  . Bilateral ovarian cysts     Past Surgical History:  Procedure Laterality Date  . ANGIOMA CAUTERY Right     Family History  Problem Relation Age of Onset  . Thyroid disease Mother   . Diabetes Mother   . Cancer Father   . Diabetes Other   . CAD Other   . Alzheimer's disease Other     Social History  Substance Use Topics  . Smoking status: Never Smoker  . Smokeless tobacco: Never Used  . Alcohol use 0.0 oz/week     Comment: once a month    Allergies:  Allergies  Allergen Reactions  . Penicillins Other (See Comments)    "childhood allergy"     Prescriptions Prior to Admission  Medication Sig Dispense Refill Last Dose  . cephALEXin (KEFLEX) 500 MG capsule Take 1 capsule (500 mg total) by mouth 4 (four) times daily. (Patient not taking: Reported on 12/16/2015) 40 capsule 0 Not Taking  . cetirizine (ZYRTEC) 10 MG tablet Take 10 mg by mouth daily.   Taking  . fluticasone (FLONASE) 50 MCG/ACT nasal spray Place 1 spray into both nostrils daily.   Taking  . HYDROcodone-acetaminophen (NORCO/VICODIN) 5-325 MG per tablet Take 2 tablets by mouth every 4 (four) hours as needed. (Patient not taking: Reported on 12/16/2015) 10 tablet 0 Not Taking  . ibuprofen (ADVIL,MOTRIN) 600 MG tablet Take 1 tablet (600 mg total) by mouth every 6 (six) hours as needed. 30 tablet 0 Taking  . ondansetron (ZOFRAN) 4 MG tablet Take 1 tablet (4 mg total) by mouth every 6 (six) hours. (Patient not taking: Reported on 12/16/2015) 12 tablet 0 Not Taking  . TRI-PREVIFEM 0.18/0.215/0.25 MG-35 MCG tablet Take 1 tablet by mouth daily. Reported on 12/16/2015   Not Taking    Review of Systems  Constitutional: Negative for chills and fever.  Gastrointestinal: Positive for nausea. Negative for constipation, diarrhea and vomiting.  Genitourinary: Positive for pelvic pain and vaginal bleeding. Negative for dysuria, frequency and urgency.   Physical Exam   Blood pressure 135/77, pulse 73, temperature 98.5 F (36.9 C), temperature source Oral, resp. rate 18, height 4\' 10"  (  1.473 m), weight 162 lb (73.5 kg), SpO2 100 %.  Physical Exam  Nursing note and vitals reviewed. Constitutional: She is oriented to person, place, and time. She appears well-developed and well-nourished. No distress.  HENT:  Head: Normocephalic.  Cardiovascular: Normal rate.   Respiratory: Effort normal.  GI: Soft. There is no tenderness. There is no rebound.  Neurological: She is alert and oriented to person, place, and time.  Skin: Skin is warm and dry.  Psychiatric: She has a normal mood and  affect.   Results for orders placed or performed during the hospital encounter of 09/20/16 (from the past 24 hour(s))  Urinalysis, Routine w reflex microscopic     Status: Abnormal   Collection Time: 09/20/16  6:30 AM  Result Value Ref Range   Color, Urine RED (A) YELLOW   APPearance CLOUDY (A) CLEAR   Specific Gravity, Urine  1.005 - 1.030    TEST NOT REPORTED DUE TO COLOR INTERFERENCE OF URINE PIGMENT   pH  5.0 - 8.0    TEST NOT REPORTED DUE TO COLOR INTERFERENCE OF URINE PIGMENT   Glucose, UA (A) NEGATIVE mg/dL    TEST NOT REPORTED DUE TO COLOR INTERFERENCE OF URINE PIGMENT   Hgb urine dipstick (A) NEGATIVE    TEST NOT REPORTED DUE TO COLOR INTERFERENCE OF URINE PIGMENT   Bilirubin Urine (A) NEGATIVE    TEST NOT REPORTED DUE TO COLOR INTERFERENCE OF URINE PIGMENT   Ketones, ur (A) NEGATIVE mg/dL    TEST NOT REPORTED DUE TO COLOR INTERFERENCE OF URINE PIGMENT   Protein, ur (A) NEGATIVE mg/dL    TEST NOT REPORTED DUE TO COLOR INTERFERENCE OF URINE PIGMENT   Nitrite (A) NEGATIVE    TEST NOT REPORTED DUE TO COLOR INTERFERENCE OF URINE PIGMENT   Leukocytes, UA (A) NEGATIVE    TEST NOT REPORTED DUE TO COLOR INTERFERENCE OF URINE PIGMENT  Urinalysis, Microscopic (reflex)     Status: Abnormal   Collection Time: 09/20/16  6:30 AM  Result Value Ref Range   RBC / HPF TOO NUMEROUS TO COUNT 0 - 5 RBC/hpf   WBC, UA 6-30 0 - 5 WBC/hpf   Bacteria, UA RARE (A) NONE SEEN   Squamous Epithelial / LPF 0-5 (A) NONE SEEN  Pregnancy, urine POC     Status: None   Collection Time: 09/20/16  6:38 AM  Result Value Ref Range   Preg Test, Ur NEGATIVE NEGATIVE  CBC     Status: None   Collection Time: 09/20/16  6:51 AM  Result Value Ref Range   WBC 6.1 4.0 - 10.5 K/uL   RBC 4.38 3.87 - 5.11 MIL/uL   Hemoglobin 13.7 12.0 - 15.0 g/dL   HCT 39.4 36.0 - 46.0 %   MCV 90.0 78.0 - 100.0 fL   MCH 31.3 26.0 - 34.0 pg   MCHC 34.8 30.0 - 36.0 g/dL   RDW 13.0 11.5 - 15.5 %   Platelets 223 150 - 400 K/uL   Comprehensive metabolic panel     Status: Abnormal   Collection Time: 09/20/16  6:51 AM  Result Value Ref Range   Sodium 137 135 - 145 mmol/L   Potassium 4.3 3.5 - 5.1 mmol/L   Chloride 105 101 - 111 mmol/L   CO2 25 22 - 32 mmol/L   Glucose, Bld 109 (H) 65 - 99 mg/dL   BUN 13 6 - 20 mg/dL   Creatinine, Ser 0.69 0.44 - 1.00 mg/dL   Calcium 8.7 (L) 8.9 -  10.3 mg/dL   Total Protein 7.4 6.5 - 8.1 g/dL   Albumin 4.5 3.5 - 5.0 g/dL   AST 17 15 - 41 U/L   ALT 17 14 - 54 U/L   Alkaline Phosphatase 64 38 - 126 U/L   Total Bilirubin 1.2 0.3 - 1.2 mg/dL   GFR calc non Af Amer >60 >60 mL/min   GFR calc Af Amer >60 >60 mL/min   Anion gap 7 5 - 15   US Transvaginal Non-ob  Result Date: 09/20/2016 CLINICAL DATA:  Pelvic pain, severe pain and burning beginning at 0200 hours are RIGHT lower quadrant radiating up and down abdomen across pelvis, has been bleeding since 07/12/2016 though skipped 2 days this past weekend, history diabetes mellitus, hypertension, ovarian cysts EXAM: TRANSABDOMINAL AND TRANSVAGINAL ULTRASOUND OF PELVIS TECHNIQUE: Both transabdominal and transvaginal ultrasound examinations of the pelvis were performed. Transabdominal technique was performed for global imaging of the pelvis including uterus, ovaries, adnexal regions, and pelvic cul-de-sac. It was necessary to proceed with endovaginal exam following the transabdominal exam to visualize the endometrium. COMPARISON:  CT abdomen pelvis 10/12/2014 FINDINGS: Uterus Measurements: 7.0 x 3.8 x 3.9 cm. Normal morphology without mass Endometrium Thickness: 3 mm thick, normal. No endometrial fluid or focal abnormality Right ovary Measurements: 3.6 x 1.4 x 2.6 cm. Normal morphology without mass. Internal blood flow present on color Doppler imaging. Left ovary Measurements: 2.2 x 2.2 x 2.8 cm. Normal morphology without mass. Internal blood flow present on color Doppler imaging. Other findings No free pelvic fluid or adnexal masses. IMPRESSION:  Normal exam. Electronically Signed   By: Lavonia Dana M.D.   On: 09/20/2016 08:28   US Pelvis Complete  Result Date: 09/20/2016 CLINICAL DATA:  Pelvic pain, severe pain and burning beginning at 0200 hours are RIGHT lower quadrant radiating up and down abdomen across pelvis, has been bleeding since 07/12/2016 though skipped 2 days this past weekend, history diabetes mellitus, hypertension, ovarian cysts EXAM: TRANSABDOMINAL AND TRANSVAGINAL ULTRASOUND OF PELVIS TECHNIQUE: Both transabdominal and transvaginal ultrasound examinations of the pelvis were performed. Transabdominal technique was performed for global imaging of the pelvis including uterus, ovaries, adnexal regions, and pelvic cul-de-sac. It was necessary to proceed with endovaginal exam following the transabdominal exam to visualize the endometrium. COMPARISON:  CT abdomen pelvis 10/12/2014 FINDINGS: Uterus Measurements: 7.0 x 3.8 x 3.9 cm. Normal morphology without mass Endometrium Thickness: 3 mm thick, normal. No endometrial fluid or focal abnormality Right ovary Measurements: 3.6 x 1.4 x 2.6 cm. Normal morphology without mass. Internal blood flow present on color Doppler imaging. Left ovary Measurements: 2.2 x 2.2 x 2.8 cm. Normal morphology without mass. Internal blood flow present on color Doppler imaging. Other findings No free pelvic fluid or adnexal masses. IMPRESSION: Normal exam. Electronically Signed   By: Lavonia Dana M.D.   On: 09/20/2016 08:28    MAU Course  Procedures  MDM   Assessment and Plan   1. Pelvic pain    DC home Comfort measures reviewed  RX: ibuprofen 800mg  PRN #[id number]  Return to MAU as needed FU with OB as planned  Follow-up Information    [facility] Follow up.   Contact information: [place] [address] [phone]            Mathis Bud 09/20/2016, 6:24 AM

## 2016-09-29 DIAGNOSIS — Z6834 Body mass index (BMI) 34.0-34.9, adult: Secondary | ICD-10-CM | POA: Diagnosis not present

## 2016-09-29 DIAGNOSIS — Z01419 Encounter for gynecological examination (general) (routine) without abnormal findings: Secondary | ICD-10-CM | POA: Diagnosis not present

## 2016-09-29 DIAGNOSIS — Z3200 Encounter for pregnancy test, result unknown: Secondary | ICD-10-CM | POA: Diagnosis not present

## 2016-09-29 DIAGNOSIS — N938 Other specified abnormal uterine and vaginal bleeding: Secondary | ICD-10-CM | POA: Diagnosis not present

## 2016-10-04 DIAGNOSIS — R1011 Right upper quadrant pain: Secondary | ICD-10-CM | POA: Diagnosis not present

## 2016-10-04 DIAGNOSIS — R1032 Left lower quadrant pain: Secondary | ICD-10-CM | POA: Diagnosis not present

## 2016-10-04 DIAGNOSIS — M940 Chondrocostal junction syndrome [Tietze]: Secondary | ICD-10-CM | POA: Diagnosis not present

## 2017-01-23 DIAGNOSIS — F411 Generalized anxiety disorder: Secondary | ICD-10-CM | POA: Diagnosis not present

## 2017-03-07 DIAGNOSIS — F411 Generalized anxiety disorder: Secondary | ICD-10-CM | POA: Diagnosis not present

## 2017-04-13 DIAGNOSIS — Z3009 Encounter for other general counseling and advice on contraception: Secondary | ICD-10-CM | POA: Diagnosis not present

## 2017-04-13 DIAGNOSIS — N6310 Unspecified lump in the right breast, unspecified quadrant: Secondary | ICD-10-CM | POA: Diagnosis not present

## 2017-04-14 ENCOUNTER — Other Ambulatory Visit: Payer: Self-pay | Admitting: Obstetrics & Gynecology

## 2017-04-14 ENCOUNTER — Ambulatory Visit
Admission: RE | Admit: 2017-04-14 | Discharge: 2017-04-14 | Disposition: A | Discharge status: Home / Self Care | Payer: BLUE CROSS/BLUE SHIELD | Source: Ambulatory Visit | Attending: Obstetrics & Gynecology | Admitting: Obstetrics & Gynecology

## 2017-04-14 DIAGNOSIS — N63 Unspecified lump in unspecified breast: Secondary | ICD-10-CM

## 2017-04-14 DIAGNOSIS — N6459 Other signs and symptoms in breast: Secondary | ICD-10-CM

## 2017-04-14 DIAGNOSIS — N6489 Other specified disorders of breast: Secondary | ICD-10-CM | POA: Diagnosis not present

## 2017-05-11 DIAGNOSIS — Z3046 Encounter for surveillance of implantable subdermal contraceptive: Secondary | ICD-10-CM | POA: Diagnosis not present

## 2017-05-12 ENCOUNTER — Other Ambulatory Visit: Payer: Self-pay | Admitting: Obstetrics & Gynecology

## 2017-05-12 DIAGNOSIS — N63 Unspecified lump in unspecified breast: Secondary | ICD-10-CM

## 2017-05-15 ENCOUNTER — Other Ambulatory Visit: Payer: Self-pay | Admitting: Obstetrics & Gynecology

## 2017-05-15 DIAGNOSIS — N6310 Unspecified lump in the right breast, unspecified quadrant: Secondary | ICD-10-CM

## 2017-08-01 DIAGNOSIS — Z713 Dietary counseling and surveillance: Secondary | ICD-10-CM | POA: Diagnosis not present

## 2017-08-16 ENCOUNTER — Other Ambulatory Visit: Payer: Self-pay | Admitting: Obstetrics & Gynecology

## 2017-08-16 ENCOUNTER — Ambulatory Visit
Admission: RE | Admit: 2017-08-16 | Discharge: 2017-08-16 | Disposition: A | Discharge status: Home / Self Care | Payer: BLUE CROSS/BLUE SHIELD | Source: Ambulatory Visit | Attending: Obstetrics & Gynecology | Admitting: Obstetrics & Gynecology

## 2017-08-16 DIAGNOSIS — N6310 Unspecified lump in the right breast, unspecified quadrant: Secondary | ICD-10-CM

## 2017-08-16 DIAGNOSIS — N6311 Unspecified lump in the right breast, upper outer quadrant: Secondary | ICD-10-CM | POA: Diagnosis not present

## 2017-09-05 DIAGNOSIS — J029 Acute pharyngitis, unspecified: Secondary | ICD-10-CM | POA: Diagnosis not present

## 2017-09-05 DIAGNOSIS — J0100 Acute maxillary sinusitis, unspecified: Secondary | ICD-10-CM | POA: Diagnosis not present

## 2017-10-05 DIAGNOSIS — Z713 Dietary counseling and surveillance: Secondary | ICD-10-CM | POA: Diagnosis not present

## 2017-11-02 DIAGNOSIS — Z01419 Encounter for gynecological examination (general) (routine) without abnormal findings: Secondary | ICD-10-CM | POA: Diagnosis not present

## 2017-11-02 DIAGNOSIS — Z6834 Body mass index (BMI) 34.0-34.9, adult: Secondary | ICD-10-CM | POA: Diagnosis not present

## 2017-11-24 DIAGNOSIS — N926 Irregular menstruation, unspecified: Secondary | ICD-10-CM | POA: Diagnosis not present

## 2017-11-24 DIAGNOSIS — Z0000 Encounter for general adult medical examination without abnormal findings: Secondary | ICD-10-CM | POA: Diagnosis not present

## 2017-11-24 DIAGNOSIS — R5383 Other fatigue: Secondary | ICD-10-CM | POA: Diagnosis not present

## 2017-11-24 DIAGNOSIS — R202 Paresthesia of skin: Secondary | ICD-10-CM | POA: Diagnosis not present

## 2017-11-29 ENCOUNTER — Other Ambulatory Visit: Payer: Self-pay

## 2017-11-29 DIAGNOSIS — M7989 Other specified soft tissue disorders: Secondary | ICD-10-CM

## 2017-11-29 DIAGNOSIS — I872 Venous insufficiency (chronic) (peripheral): Secondary | ICD-10-CM

## 2017-11-30 DIAGNOSIS — Z713 Dietary counseling and surveillance: Secondary | ICD-10-CM | POA: Diagnosis not present

## 2018-01-04 ENCOUNTER — Other Ambulatory Visit: Payer: Self-pay | Admitting: Radiology

## 2018-01-04 DIAGNOSIS — N6310 Unspecified lump in the right breast, unspecified quadrant: Secondary | ICD-10-CM

## 2018-01-04 DIAGNOSIS — N6459 Other signs and symptoms in breast: Secondary | ICD-10-CM | POA: Diagnosis not present

## 2018-01-10 ENCOUNTER — Ambulatory Visit
Admission: RE | Admit: 2018-01-10 | Discharge: 2018-01-10 | Disposition: A | Discharge status: Home / Self Care | Payer: BLUE CROSS/BLUE SHIELD | Source: Ambulatory Visit | Attending: Radiology | Admitting: Radiology

## 2018-01-10 DIAGNOSIS — N6311 Unspecified lump in the right breast, upper outer quadrant: Secondary | ICD-10-CM | POA: Diagnosis not present

## 2018-01-10 DIAGNOSIS — N6310 Unspecified lump in the right breast, unspecified quadrant: Secondary | ICD-10-CM

## 2018-01-11 ENCOUNTER — Other Ambulatory Visit: Payer: Self-pay | Admitting: Obstetrics & Gynecology

## 2018-01-11 DIAGNOSIS — N6310 Unspecified lump in the right breast, unspecified quadrant: Secondary | ICD-10-CM

## 2018-01-16 ENCOUNTER — Ambulatory Visit (HOSPITAL_COMMUNITY)
Admission: RE | Admit: 2018-01-16 | Discharge: 2018-01-16 | Disposition: A | Discharge status: Home / Self Care | Payer: BLUE CROSS/BLUE SHIELD | Source: Ambulatory Visit | Attending: Vascular Surgery | Admitting: Vascular Surgery

## 2018-01-16 ENCOUNTER — Ambulatory Visit (INDEPENDENT_AMBULATORY_CARE_PROVIDER_SITE_OTHER): Payer: BLUE CROSS/BLUE SHIELD | Admitting: Vascular Surgery

## 2018-01-16 ENCOUNTER — Other Ambulatory Visit: Payer: Self-pay

## 2018-01-16 ENCOUNTER — Encounter: Payer: Self-pay | Admitting: Vascular Surgery

## 2018-01-16 VITALS — BP 121/80 | HR 71 | Temp 98.1°F | Resp 16 | Ht <= 58 in | Wt 165.0 lb

## 2018-01-16 DIAGNOSIS — I872 Venous insufficiency (chronic) (peripheral): Secondary | ICD-10-CM | POA: Insufficient documentation

## 2018-01-16 DIAGNOSIS — M7989 Other specified soft tissue disorders: Secondary | ICD-10-CM

## 2018-01-16 NOTE — Progress Notes (Signed)
Vascular and Vein Specialist of [facility]  Patient name: Savannah Beck MRN: [id number] DOB: 1991/07/24 Sex: female  REASON FOR VISIT: Continued evaluation of left leg swelling  HPI: Savannah Beck is a 27 y.o. female today for continued discussion of left leg swelling.  She had seen Dr. Scot Dock in 2017.  At that time she underwent a venous reflux study showing no significant reflux.  It was suggested that she treat this with elevation and compression.  She presents today for further discussion.  She reports that she has had worsening of her leg swelling.  She does not note any swelling on the right leg.  This is whole leg on her left from her thigh extending through her calf into her foot.  History of DVT.  No evidence of physical findings suggesting lymphedema.  Past Medical History:  Diagnosis Date  . Bilateral ovarian cysts     Family History  Problem Relation Age of Onset  . Thyroid disease Mother   . Diabetes Mother   . Cancer Father   . Diabetes Other   . CAD Other   . Alzheimer's disease Other   . Breast cancer Paternal Grandmother     SOCIAL HISTORY: Social History   Tobacco Use  . Smoking status: Never Smoker  . Smokeless tobacco: Never Used  Substance Use Topics  . Alcohol use: Yes    Alcohol/week: 0.0 oz    Comment: once a month    Allergies  Allergen Reactions  . Penicillins Other (See Comments)    "childhood allergy"    Current Outpatient Medications  Medication Sig Dispense Refill  . cephALEXin (KEFLEX) 500 MG capsule Take 1 capsule (500 mg total) by mouth 4 (four) times daily. 40 capsule 0  . cetirizine (ZYRTEC) 10 MG tablet Take 10 mg by mouth daily.    . fluticasone (FLONASE) 50 MCG/ACT nasal spray Place 1 spray into both nostrils daily.    Marland Kitchen ibuprofen (ADVIL,MOTRIN) 800 MG tablet Take 1 tablet (800 mg total) by mouth every 8 (eight) hours as needed. 30 tablet 0  . ondansetron (ZOFRAN) 4 MG tablet Take 1 tablet  (4 mg total) by mouth every 6 (six) hours. 12 tablet 0  . TRI-PREVIFEM 0.18/0.215/0.25 MG-35 MCG tablet Take 1 tablet by mouth daily. Reported on 12/16/2015     No current facility-administered medications for this visit.     REVIEW OF SYSTEMS:  [X]  denotes positive finding, [ ]  denotes negative finding Cardiac  Comments:  Chest pain or chest pressure:    Shortness of breath upon exertion:    Short of breath when lying flat:    Irregular heart rhythm:        Vascular    Pain in calf, thigh, or hip brought on by ambulation:    Pain in feet at night that wakes you up from your sleep:     Blood clot in your veins:    Leg swelling:  x         PHYSICAL EXAM: Vitals:   01/16/18 0918  BP: 121/80  Pulse: 71  Resp: 16  Temp: 98.1 F (36.7 C)  TempSrc: Oral  SpO2: 98%  Weight: 165 lb (74.8 kg)  Height: 4\' 10"  (1.473 m)    GENERAL: The patient is a well-nourished female, in no acute distress. The vital signs are documented above. CARDIOVASCULAR: Palpable dorsalis pedis pulses bilaterally. PULMONARY: There is good air exchange  MUSCULOSKELETAL: There are no major deformities or cyanosis. NEUROLOGIC: No focal weakness or  paresthesias are detected. SKIN: There are no ulcers or rashes noted.  No changes of venous hypertension in her right or left leg PSYCHIATRIC: The patient has a normal affect.  DATA:  Ileal caval venous duplex showed patency of her vena cava and patency of her common and external iliac veins bilaterally.  There was no evidence of thrombus in the segments compression of her left iliac vein.  MEDICAL ISSUES: I discussed these findings at length with the patient and her significant other present.  I explained that further investigation would entail CT the venogram rule out venous compression.  Do not feel that this is putting area at any increased risk for a DVT currently.  She will continue with elevation and notify should she wish to proceed with a CT for further  work-up    Rosetta Posner, MD [facility] Specialists of [facility] Tel [phone] Pager [phone]

## 2018-01-17 DIAGNOSIS — I868 Varicose veins of other specified sites: Secondary | ICD-10-CM

## 2018-01-26 DIAGNOSIS — I868 Varicose veins of other specified sites: Secondary | ICD-10-CM

## 2018-02-08 DIAGNOSIS — Z713 Dietary counseling and surveillance: Secondary | ICD-10-CM | POA: Diagnosis not present

## 2018-02-15 ENCOUNTER — Other Ambulatory Visit: Payer: BLUE CROSS/BLUE SHIELD

## 2018-05-11 DIAGNOSIS — K011 Impacted teeth: Secondary | ICD-10-CM | POA: Diagnosis not present

## 2018-06-04 DIAGNOSIS — Z23 Encounter for immunization: Secondary | ICD-10-CM | POA: Diagnosis not present

## 2018-06-04 DIAGNOSIS — M7989 Other specified soft tissue disorders: Secondary | ICD-10-CM | POA: Diagnosis not present

## 2018-06-04 DIAGNOSIS — L309 Dermatitis, unspecified: Secondary | ICD-10-CM | POA: Diagnosis not present

## 2018-06-04 DIAGNOSIS — R1909 Other intra-abdominal and pelvic swelling, mass and lump: Secondary | ICD-10-CM | POA: Diagnosis not present

## 2018-06-28 DIAGNOSIS — Z713 Dietary counseling and surveillance: Secondary | ICD-10-CM | POA: Diagnosis not present

## 2018-07-19 DIAGNOSIS — R600 Localized edema: Secondary | ICD-10-CM | POA: Diagnosis not present

## 2018-08-17 ENCOUNTER — Other Ambulatory Visit: Payer: Self-pay | Admitting: Obstetrics & Gynecology

## 2018-08-17 ENCOUNTER — Ambulatory Visit
Admission: RE | Admit: 2018-08-17 | Discharge: 2018-08-17 | Disposition: A | Discharge status: Home / Self Care | Payer: BLUE CROSS/BLUE SHIELD | Source: Ambulatory Visit | Attending: Obstetrics & Gynecology | Admitting: Obstetrics & Gynecology

## 2018-08-17 ENCOUNTER — Other Ambulatory Visit: Payer: BLUE CROSS/BLUE SHIELD

## 2018-08-17 DIAGNOSIS — N6310 Unspecified lump in the right breast, unspecified quadrant: Secondary | ICD-10-CM

## 2018-08-17 DIAGNOSIS — N6311 Unspecified lump in the right breast, upper outer quadrant: Secondary | ICD-10-CM | POA: Diagnosis not present

## 2018-09-12 DIAGNOSIS — Z713 Dietary counseling and surveillance: Secondary | ICD-10-CM | POA: Diagnosis not present

## 2018-09-13 DIAGNOSIS — I83892 Varicose veins of left lower extremities with other complications: Secondary | ICD-10-CM | POA: Diagnosis not present

## 2018-09-13 DIAGNOSIS — I83812 Varicose veins of left lower extremities with pain: Secondary | ICD-10-CM | POA: Diagnosis not present

## 2018-12-04 DIAGNOSIS — Z0000 Encounter for general adult medical examination without abnormal findings: Secondary | ICD-10-CM | POA: Diagnosis not present

## 2018-12-04 DIAGNOSIS — E785 Hyperlipidemia, unspecified: Secondary | ICD-10-CM | POA: Diagnosis not present

## 2019-01-04 DIAGNOSIS — Z6834 Body mass index (BMI) 34.0-34.9, adult: Secondary | ICD-10-CM | POA: Diagnosis not present

## 2019-01-04 DIAGNOSIS — Z01419 Encounter for gynecological examination (general) (routine) without abnormal findings: Secondary | ICD-10-CM | POA: Diagnosis not present

## 2019-01-28 DIAGNOSIS — M79605 Pain in left leg: Secondary | ICD-10-CM | POA: Diagnosis not present

## 2019-01-28 DIAGNOSIS — I8312 Varicose veins of left lower extremity with inflammation: Secondary | ICD-10-CM | POA: Diagnosis not present

## 2019-02-05 DIAGNOSIS — F411 Generalized anxiety disorder: Secondary | ICD-10-CM | POA: Diagnosis not present

## 2019-02-05 DIAGNOSIS — F3289 Other specified depressive episodes: Secondary | ICD-10-CM | POA: Diagnosis not present

## 2019-02-07 DIAGNOSIS — N939 Abnormal uterine and vaginal bleeding, unspecified: Secondary | ICD-10-CM | POA: Diagnosis not present

## 2019-02-07 DIAGNOSIS — R319 Hematuria, unspecified: Secondary | ICD-10-CM | POA: Diagnosis not present

## 2019-02-13 DIAGNOSIS — F3289 Other specified depressive episodes: Secondary | ICD-10-CM | POA: Diagnosis not present

## 2019-02-13 DIAGNOSIS — F411 Generalized anxiety disorder: Secondary | ICD-10-CM | POA: Diagnosis not present

## 2019-02-27 DIAGNOSIS — Z20828 Contact with and (suspected) exposure to other viral communicable diseases: Secondary | ICD-10-CM | POA: Diagnosis not present

## 2019-02-28 DIAGNOSIS — Z20828 Contact with and (suspected) exposure to other viral communicable diseases: Secondary | ICD-10-CM | POA: Diagnosis not present

## 2019-03-22 ENCOUNTER — Encounter (HOSPITAL_COMMUNITY): Payer: Self-pay | Admitting: Emergency Medicine

## 2019-03-22 ENCOUNTER — Other Ambulatory Visit: Payer: Self-pay

## 2019-03-22 ENCOUNTER — Emergency Department (HOSPITAL_COMMUNITY): Payer: BC Managed Care – PPO

## 2019-03-22 ENCOUNTER — Emergency Department (HOSPITAL_COMMUNITY)
Admission: EM | Admit: 2019-03-22 | Discharge: 2019-03-22 | Disposition: A | Discharge status: Home / Self Care | Payer: BC Managed Care – PPO | Attending: Emergency Medicine | Admitting: Emergency Medicine

## 2019-03-22 DIAGNOSIS — Z79899 Other long term (current) drug therapy: Secondary | ICD-10-CM | POA: Diagnosis not present

## 2019-03-22 DIAGNOSIS — R109 Unspecified abdominal pain: Secondary | ICD-10-CM | POA: Diagnosis not present

## 2019-03-22 DIAGNOSIS — N3000 Acute cystitis without hematuria: Secondary | ICD-10-CM | POA: Insufficient documentation

## 2019-03-22 DIAGNOSIS — Z880 Allergy status to penicillin: Secondary | ICD-10-CM | POA: Diagnosis not present

## 2019-03-22 DIAGNOSIS — R1033 Periumbilical pain: Secondary | ICD-10-CM | POA: Diagnosis not present

## 2019-03-22 DIAGNOSIS — R079 Chest pain, unspecified: Secondary | ICD-10-CM | POA: Diagnosis not present

## 2019-03-22 LAB — COMPREHENSIVE METABOLIC PANEL
ALT: 17 U/L (ref 0–44)
AST: 18 U/L (ref 15–41)
Albumin: 4.1 g/dL (ref 3.5–5.0)
Alkaline Phosphatase: 66 U/L (ref 38–126)
Anion gap: 11 (ref 5–15)
BUN: 6 mg/dL (ref 6–20)
CO2: 20 mmol/L — ABNORMAL LOW (ref 22–32)
Calcium: 9.1 mg/dL (ref 8.9–10.3)
Chloride: 107 mmol/L (ref 98–111)
Creatinine, Ser: 0.73 mg/dL (ref 0.44–1.00)
GFR calc Af Amer: >60 mL/min (ref >60)
GFR calc non Af Amer: >60 mL/min (ref >60)
Glucose, Bld: 100 mg/dL — ABNORMAL HIGH (ref 70–99)
Potassium: 4 mmol/L (ref 3.5–5.1)
Sodium: 138 mmol/L (ref 135–145)
Total Bilirubin: 1 mg/dL (ref 0.3–1.2)
Total Protein: 7.2 g/dL (ref 6.5–8.1)

## 2019-03-22 LAB — CBC
HCT: 41.8 % (ref 36.0–46.0)
Hemoglobin: 14.2 g/dL (ref 12.0–15.0)
MCH: 31.7 pg (ref 26.0–34.0)
MCHC: 34 g/dL (ref 30.0–36.0)
MCV: 93.3 fL (ref 80.0–100.0)
Platelets: 265 10*3/uL (ref 150–400)
RBC: 4.48 MIL/uL (ref 3.87–5.11)
RDW: 11.6 % (ref 11.5–15.5)
WBC: 9.1 10*3/uL (ref 4.0–10.5)
nRBC: 0 % (ref 0.0–0.2)

## 2019-03-22 LAB — URINALYSIS, ROUTINE W REFLEX MICROSCOPIC
Bilirubin Urine: NEGATIVE
Glucose, UA: NEGATIVE mg/dL
Hgb urine dipstick: NEGATIVE
Ketones, ur: 20 mg/dL — AB
Nitrite: NEGATIVE
Protein, ur: NEGATIVE mg/dL
Specific Gravity, Urine: 1.013 (ref 1.005–1.030)
pH: 6 (ref 5.0–8.0)

## 2019-03-22 LAB — I-STAT BETA HCG BLOOD, ED (MC, WL, AP ONLY): I-stat hCG, quantitative: <5 m[IU]/mL (ref <5)

## 2019-03-22 LAB — LIPASE, BLOOD: Lipase: 23 U/L (ref 11–51)

## 2019-03-22 MED ORDER — SODIUM CHLORIDE 0.9 % IV BOLUS
1000.0000 mL | Freq: Once | INTRAVENOUS | Status: AC
  Administered 2019-03-22: 16:00:00 1000 mL via INTRAVENOUS

## 2019-03-22 MED ORDER — ONDANSETRON HCL 4 MG/2ML IJ SOLN
4.0000 mg | Freq: Once | INTRAMUSCULAR | Status: AC
  Administered 2019-03-22: 16:00:00 4 mg via INTRAVENOUS
  Filled 2019-03-22: qty 2

## 2019-03-22 MED ORDER — CEPHALEXIN 500 MG PO CAPS: 500.0000 mg | ORAL_CAPSULE | Freq: Two times a day (BID) | ORAL | 0 refills | Status: DC

## 2019-03-22 MED ORDER — SODIUM CHLORIDE 0.9% FLUSH
3.0000 mL | Freq: Once | INTRAVENOUS | Status: AC
  Administered 2019-03-22: 17:00:00 3 mL via INTRAVENOUS

## 2019-03-22 MED ORDER — SODIUM CHLORIDE 0.9 % IV SOLN
1.0000 g | Freq: Once | INTRAVENOUS | Status: AC
  Administered 2019-03-22: 19:00:00 1 g via INTRAVENOUS
  Filled 2019-03-22: qty 10

## 2019-03-22 MED ORDER — CEPHALEXIN 500 MG PO CAPS: 500.0000 mg | ORAL_CAPSULE | Freq: Two times a day (BID) | ORAL | 0 refills | Status: AC

## 2019-03-22 MED ORDER — IOHEXOL 300 MG/ML  SOLN
100.0000 mL | Freq: Once | INTRAMUSCULAR | Status: AC | PRN
  Administered 2019-03-22: 18:00:00 100 mL via INTRAVENOUS

## 2019-03-22 NOTE — ED Provider Notes (Signed)
I saw and evaluated the patient, reviewed the resident's note and I agree with the findings and plan.  Pertinent History: This patient is a very pleasant 28 year old female who has had a history of a possible periumbilical hernia according to what she reports her gynecologist told her.  She reports that in the last 3 days she developed increasing pain and a burning sensation around the umbilicus and spreading to the right.  It seems to have intensified in the last 24 hours prompting her visit to the doctor.  She denies vomiting or changes in bowel habits.  Pertinent Exam findings: On exam the patient has a very soft non-peritoneal abdomen but has guarding around her umbilicus as well as down towards the right lower quadrant.  No left-sided abdominal tenderness at all, no right upper quadrant or epigastric tenderness, no Murphy sign.  The patient will need to have an evaluation for possible appendicitis, incarcerated hernia or other surgical causes.  I personally do not feel any signs of a hernia in the periumbilical region and visually the abdomen appears normal, symmetrical and without deformity.  Laboratory work-up shows no leukocytosis, normal metabolic panel, unremarkable pregnancy, chest x-ray unremarkable, CT scan pending, anticipate discharge if negative  I was personally present and directly supervised the following procedures:  Medical evaluation  I personally interpreted the EKG as well as the resident and agree with the interpretation on the resident's chart.  Final diagnoses:  Acute cystitis without hematuria  Periumbilical abdominal pain      Noemi Chapel, MD 03/23/19 [phone]

## 2019-03-22 NOTE — ED Triage Notes (Signed)
Pt. Stated, Savannah Beck had stomach pain at the belly button since Tuesday.  Went to Dr. This morning and said , appendicitis

## 2019-03-22 NOTE — Discharge Instructions (Addendum)
Your testing today did not show any surgical causes of your pain.  Your urinalysis did reveal signs of a urinary tract infection.  Please take Keflex twice daily for the next 7 days to finish the treatment for this infection.  Seek medical exam immediately for severe or worsening symptoms.

## 2019-03-22 NOTE — ED Provider Notes (Signed)
[facility] EMERGENCY DEPARTMENT Provider Note   CSN: [id number] Arrival date & time: 03/22/19  1406    History   Chief Complaint Chief Complaint  Patient presents with   Abdominal Pain   Hernia    HPI Savannah Beck is a 28 y.o. female.     HPI  Patient is a 28 year old female with no significant past medical history who presents to the emergency department today for evaluation of a 3-day history of abdominal pain that started in the periumbilical region and now involves the right lower quadrant.  She denies any fever, chills, shortness of breath, vomiting, diarrhea or constipation.  She does report nausea.  She also reports decreased appetite.  Decreased urination but denies dysuria or hematuria.  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      Home Medications    Prior to Admission medications   Medication Sig Start Date End Date Taking? Authorizing Provider  cephALEXin (KEFLEX) 500 MG capsule Take 1 capsule (500 mg total) by mouth 2 (two) times daily for 7 days. 03/22/19 03/29/19  Jefm Petty, MD  cetirizine (ZYRTEC) 10 MG tablet Take 10 mg by mouth daily.    [provider]  fluticasone (FLONASE) 50 MCG/ACT nasal spray Place 1 spray into both nostrils daily.    [provider]  ibuprofen (ADVIL,MOTRIN) 800 MG tablet Take 1 tablet (800 mg total) by mouth every 8 (eight) hours as needed. 09/20/16   Marcille Buffy D, CNM  ondansetron (ZOFRAN) 4 MG tablet Take 1 tablet (4 mg total) by mouth every 6 (six) hours. 10/12/14   Rancour, Annie Main, MD  TRI-PREVIFEM 0.18/0.215/0.25 MG-35 MCG tablet  Take 1 tablet by mouth daily. Reported on 12/16/2015 01/14/12   [provider]    Family History Family History  Problem Relation Age of Onset   Thyroid disease Mother    Diabetes Mother    Cancer Father    Diabetes Other    CAD Other    Alzheimer's disease Other    Breast cancer Paternal Grandmother     Social History Social History   Tobacco Use   Smoking status: Never Smoker   Smokeless tobacco: Never Used  Substance Use Topics   Alcohol use: Yes    Alcohol/week: 0.0 standard drinks    Comment: once a month   Drug use: No     Allergies   Penicillins   Review of Systems Review of Systems  Constitutional: Positive for appetite change. Negative for chills and fever.  HENT: Negative for ear pain and sore throat.   Eyes: Negative for pain and visual disturbance.  Respiratory: Negative for cough and shortness of breath.   Cardiovascular: Negative for chest pain and palpitations.  Gastrointestinal: Positive for abdominal pain and nausea. Negative for blood in stool, constipation, diarrhea and vomiting.  Genitourinary: Negative for dysuria and hematuria.  Musculoskeletal: Negative for arthralgias and back pain.  Skin: Negative for color change and rash.  Neurological: Negative for seizures and syncope.  All other systems reviewed and are negative.    Physical Exam ED Triage Vitals [03/22/19 1416]  Enc Vitals Group     BP 133/87     Pulse Rate 81     Resp 16     Temp 99.5 F (37.5 C)     Temp Source Oral     SpO2 99 %    Updated Vital Signs BP 114/74    Pulse 68    Temp 99.5 F (37.5 C) (Oral)    Resp 18    Ht 4\' 10"  (1.473 m)    Wt 74.8 kg    LMP 03/12/2019    SpO2 98%    BMI 34.49 kg/m   Physical Exam Vitals signs and nursing note reviewed.  Constitutional:      General: She is not in acute distress.    Appearance: She is well-developed. She is not ill-appearing.  HENT:     Head: Normocephalic and atraumatic.     Mouth/Throat:      Mouth: Mucous membranes are moist.  Eyes:     Conjunctiva/sclera: Conjunctivae normal.  Neck:     Musculoskeletal: Neck supple.  Cardiovascular:     Rate and Rhythm: Normal rate and regular rhythm.     Heart sounds: No murmur.  Pulmonary:     Effort: Pulmonary effort is normal. No respiratory distress.     Breath sounds: Normal breath sounds.  Abdominal:     General: Abdomen is flat. Bowel sounds are decreased.     Palpations: Abdomen is soft.     Tenderness: There is abdominal tenderness in the periumbilical area. There is no rebound. Positive signs include McBurney's sign. Negative signs include Murphy's sign.  Skin:    General: Skin is warm and dry.     Capillary Refill: Capillary refill takes less than 2 seconds.  Neurological:     General: No focal deficit present.     Mental Status: She is alert.      ED Treatments / Results  Labs (all labs ordered are listed, but only abnormal results are displayed) Labs Reviewed  COMPREHENSIVE METABOLIC PANEL - Abnormal; Notable for the following components:      Result Value   CO2 20 (*)    Glucose, Bld 100 (*)    All other components within normal limits  URINALYSIS, ROUTINE W REFLEX MICROSCOPIC - Abnormal; Notable for the following components:   APPearance HAZY (*)    Ketones, ur 20 (*)    Leukocytes,Ua LARGE (*)    Bacteria, UA MANY (*)    All other components within normal limits  URINE CULTURE  LIPASE, BLOOD  CBC  I-STAT BETA HCG BLOOD, ED ([facility], [facility], [facility] ONLY)    EKG None  Radiology Ct Abdomen Pelvis W Contrast  Result Date: 03/22/2019 CLINICAL DATA:  Abdominal pain near the umbilicus for several days, initial encounter EXAM: CT ABDOMEN AND PELVIS WITH CONTRAST TECHNIQUE: Multidetector CT imaging of the abdomen and pelvis was performed using the standard protocol following bolus administration of intravenous contrast. CONTRAST:  16mL OMNIPAQUE IOHEXOL 300 MG/ML  SOLN COMPARISON:  10/12/2014 FINDINGS: Lower chest: No  acute abnormality. Hepatobiliary: No focal liver abnormality is seen. No gallstones, gallbladder wall thickening, or biliary dilatation. Pancreas: Unremarkable. No pancreatic ductal dilatation or surrounding inflammatory changes. Spleen: Normal in size without focal abnormality. Adrenals/Urinary Tract: Adrenal glands are within normal limits. Kidneys are well visualized bilaterally. No renal calculi or obstructive changes are seen. The ureters are within normal limits. The bladder is decompressed. Stomach/Bowel: Colon shows no obstructive or inflammatory changes. The appendix is air-filled and without significant  inflammatory change. Small bowel and stomach are unremarkable. Vascular/Lymphatic: No significant vascular findings are present. No enlarged abdominal or pelvic lymph nodes. Reproductive: Uterus and bilateral adnexa are unremarkable. Other: No abdominal wall hernia or abnormality. No abdominopelvic ascites. Musculoskeletal: No acute or significant osseous findings. IMPRESSION: No evidence of appendicitis. No acute abnormality seen. Electronically Signed   By: Inez Catalina M.D.   On: 03/22/2019 19:03   Dg Chest Portable 1 View  Result Date: 03/22/2019 CLINICAL DATA:  Pain around navel since Tuesday, sent by doctor for possible appendicitis. EXAM: PORTABLE CHEST 1 VIEW COMPARISON:  09/29/2009 FINDINGS: Heart size is accentuated by the AP position of the patient. The lungs are free of focal consolidations and pleural effusions. No pulmonary edema. IMPRESSION: No active disease. Electronically Signed   By: Nolon Nations M.D.   On: 03/22/2019 16:24    Procedures Procedures (including critical care time)  Medications Ordered in ED Medications  sodium chloride flush (NS) 0.9 % injection 3 mL (3 mLs Intravenous Given 03/22/19 1724)  sodium chloride 0.9 % bolus 1,000 mL (0 mLs Intravenous Stopped 03/22/19 1723)  ondansetron (ZOFRAN) injection 4 mg (4 mg Intravenous Given 03/22/19 1606)  cefTRIAXone  (ROCEPHIN) 1 g in sodium chloride 0.9 % 100 mL IVPB (0 g Intravenous Stopped 03/22/19 2038)  iohexol (OMNIPAQUE) 300 MG/ML solution 100 mL (100 mLs Intravenous Contrast Given 03/22/19 1759)     Initial Impression / Assessment and Plan / ED Course  I have reviewed the triage vital signs and the nursing notes.  Pertinent labs & imaging results that were available during my care of the patient were reviewed by me and considered in my medical decision making (see chart for details).  Of note, this patient was evaluated in the Emergency Department for the symptoms described in the history of present illness. She was evaluated in the context of the global COVID-19 pandemic, which necessitated consideration that the patient might be at risk for infection with the SARS-CoV-2 virus that causes COVID-19. Institutional protocols and algorithms that pertain to the evaluation of patients at risk for COVID-19 are in a state of rapid change based on information released by regulatory bodies including the CDC and federal and state organizations. These policies and algorithms were followed during the patient's care in the ED.  During this patient encounter, the patient was wearing a mask, and throughout this encounter I was wearing at least a surgical mask.  I was not within 6 feet of this patient for more than 15 minutes without eye protection when they were not wearing mask.   Differentials considered: acute appendicitis, peritonitis, cholelithiasis, cholecystitis, constipation   EM Physician interpretation of Labs & Imaging:  Normal lipase  CBC without leukocytosis, normal hgb/hct and plts  CMP with mildly decreased bicarb with co2 of 20  Medical Decision Making:  Janett Kamath is a 28 y.o. female with no significant PMHx who presented to the ED today with a 3 day history of abdominal pain that started periumbilical and now radiates to the RLQ in the region of McBurney's point. She has not had fever or  chills and has a normal WBC, however, her HPI and physical examination is still concerning. Will obtain CT AP, give IVF and antiemetics and reassess.   On reassessment, patient has no additional complaints.  She has no further nausea and has not vomited.  I have reviewed her laboratory work-up as well as the CT imaging.  Her work-up is consistent with an acute urinary tract infection  without hematuria.  CT abdomen and pelvis is negative for acute appendicitis or other obvious acute intra-abdominal abnormality to otherwise account for the patient's pain.  She will be given IV Rocephin and prescription for outpatient antibiotics.  The plan for this patient was discussed with my attending physician, Dr. Noemi Chapel, who voiced agreement and who oversaw evaluation and treatment of this patient.   CLINICAL IMPRESSION: 1. Acute cystitis without hematuria   2. Periumbilical abdominal pain      Disposition: Discharge  Key discharge instructions: Strict return precautions provided. Patient was encouraged to return to the ED should they experience worsening or persistence of current symptoms, or should they develop new concerning symptoms. Encouraged them to f/u with their PCP on an outpatient basis. Questions regarding the diagnosis were answered, and side effects regarding therapies were provided in writing or orally. Patient discharged in stable condition.   Audria Takeshita A. Jimmye Norman, MD Resident Physician, PGY-3 Emergency Medicine [facility]    Jefm Petty, MD 03/23/19 1194    Noemi Chapel, MD 03/23/19 [phone]

## 2019-03-22 NOTE — ED Notes (Signed)
Pt ambulated to restroom without assistance.

## 2019-03-22 NOTE — ED Notes (Signed)
Urine culture sent with UA sample

## 2019-03-24 LAB — URINE CULTURE: Culture: 10000 — AB

## 2019-03-25 ENCOUNTER — Telehealth: Payer: Self-pay | Admitting: Emergency Medicine

## 2019-03-25 NOTE — Telephone Encounter (Signed)
Post ED Visit - Positive Culture Follow-up  Culture report reviewed by antimicrobial stewardship pharmacist: [place] Team []  Elenor Quinones, Pharm.D. []  Heide Guile, Pharm.D., BCPS AQ-ID []  Parks Neptune, Pharm.D., BCPS []  Alycia Rossetti, Pharm.D., BCPS []  [place], Pharm.D., BCPS, AAHIVP []  Legrand Como, Pharm.D., BCPS, AAHIVP []  Salome Arnt, PharmD, BCPS []  Johnnette Gourd, PharmD, BCPS []  Hughes Better, PharmD, BCPS []  Leeroy Cha, PharmD []  Laqueta Linden, PharmD, BCPS []  Albertina Parr, PharmD Elicia Lamp PharmD  [place] Team []  Leodis Sias, PharmD []  Lindell Spar, PharmD []  Royetta Asal, PharmD []  Graylin Shiver, Rph []  Rema Fendt) Glennon Mac, PharmD []  Arlyn Dunning, PharmD []  Netta Cedars, PharmD []  Dia Sitter, PharmD []  Leone Haven, PharmD []  Gretta Arab, PharmD []  Theodis Shove, PharmD []  Peggyann Juba, PharmD []  Reuel Boom, PharmD   Positive urine culture Treated with cephalexin, organism sensitive to the same and no further patient follow-up is required at this time.  Hazle Nordmann 03/25/2019, 10:30 AM

## 2019-04-02 DIAGNOSIS — M79605 Pain in left leg: Secondary | ICD-10-CM | POA: Diagnosis not present

## 2019-04-03 DIAGNOSIS — N390 Urinary tract infection, site not specified: Secondary | ICD-10-CM | POA: Diagnosis not present

## 2019-04-05 DIAGNOSIS — M79605 Pain in left leg: Secondary | ICD-10-CM | POA: Diagnosis not present

## 2019-04-12 DIAGNOSIS — M79605 Pain in left leg: Secondary | ICD-10-CM | POA: Diagnosis not present

## 2019-04-26 DIAGNOSIS — M79605 Pain in left leg: Secondary | ICD-10-CM | POA: Diagnosis not present

## 2019-05-07 ENCOUNTER — Encounter: Payer: Self-pay | Admitting: Registered"

## 2019-05-07 ENCOUNTER — Other Ambulatory Visit: Payer: Self-pay

## 2019-05-07 ENCOUNTER — Encounter: Payer: BC Managed Care – PPO | Attending: Family Medicine | Admitting: Registered"

## 2019-05-07 DIAGNOSIS — E669 Obesity, unspecified: Secondary | ICD-10-CM | POA: Diagnosis not present

## 2019-05-07 NOTE — Patient Instructions (Addendum)
-   Have 1/2 plate as non-starchy vegetables. Non-starchy vegetables include any vegetables other than potatoes, corn, and peas.

## 2019-05-07 NOTE — Progress Notes (Signed)
Medical Nutrition Therapy:  Appt start time: 8:52 end time:  9:45.   Assessment:  Primary concerns today: Pt arrives stating her weight has increased recently.   Pt expectations: what things are good for her  Pt states she has been working at [employer] for 3 years.   States Mom was diagnosed with diabetes after significant weight gain and pt is concerned because her weight has increased and her recent labs revealed increased FBS value. Reports stress of job led to more eating and increased weight. States sometimes she will eat an entire bag of chips. States she wants to take proactive stepa to get things under control. Has been working with a friend who is a Garment/textile technologist. Reports family history of blood sugar issues. Also states she grew up in large family and poor and now has a lot of food issues on her hand. States she is 1 of 6 children and has an identical twin.   Pt reports she needs structure. States she usually nauseated in am and will eat fruit or pop tarts. On mornings when she doesn't feel nauseated pt eats oatmeal. States she has an issue with portion control.   Reports previous cholesterol was about 173. States she has had issues with high cholesterol.   States she has been seeing a Garment/textile technologist discussing food habits. Also has gym at her office with treadmill and free weights.   Pt states she has been told for a long time that she needs to lose weight but is more concerned about her health. States she does not want to participate in diets and prefers 1 goal at a time for long-term lifestyle changes.    Preferred Learning Style:   No preference indicated   Learning Readiness:   Ready  Change in progress   MEDICATIONS: See list   DIETARY INTAKE:  Usual eating pattern includes 3 meals and 1 snacks per day.  Everyday foods include pasta, salad, fruit, vegetables.  Avoided foods include red meat, cilantro, [place] food, and large amount of tomatoes.    24-hr recall:  B ( AM):  pop tarts or apple or oatmeal  Snk ( AM): none  L ( PM): salad + chips + seltzer water + 2 oreos Snk ( PM): chips D ( PM): Ramen noodles + frozen vegetables or spaghetti + bread or chicken + vegetables + potatoes or restaurant-black bean burger + fries Snk ( PM): none Beverages: seltzer water, lemonade, water (64 oz), soda (once/week), chai tea, black coffee  Usual physical activity: averages 6000-9000 steps/day   Estimated energy needs: 2000 calories 225 g carbohydrates 150 g protein 56 g fat  Progress Towards Goal(s):  In progress.   Nutritional Diagnosis:  NB-1.1 Food and nutrition-related knowledge deficit As related to uncertainty how to apply nutrition information.  As evidenced by pt verablizes incomplete information.    Intervention:  Nutrition education and counseling. Pt was educated and counseled on the benefits of increasing vegetable intake, having balanced meals with multiple food groups, role of carbohydrates in the body, carbohydrate digestion, and benefits of physical activity. Pt was in agreement with goal listed.  Goals: - Have 1/2 plate as non-starchy vegetables. Non-starchy vegetables include any vegetables other than potatoes, corn, and peas.   Teaching Method Utilized:  Visual Auditory Hands on  Handouts given during visit include:  none  Barriers to learning/adherence to lifestyle change: none identified  Demonstrated degree of understanding via:  Teach Back   Monitoring/Evaluation:  Dietary intake, exercise, and body  weight in 1 month(s).

## 2019-05-13 DIAGNOSIS — R350 Frequency of micturition: Secondary | ICD-10-CM | POA: Diagnosis not present

## 2019-05-13 DIAGNOSIS — R300 Dysuria: Secondary | ICD-10-CM | POA: Diagnosis not present

## 2019-06-05 ENCOUNTER — Ambulatory Visit: Payer: BC Managed Care – PPO | Admitting: Registered"

## 2019-07-02 DIAGNOSIS — I83892 Varicose veins of left lower extremities with other complications: Secondary | ICD-10-CM | POA: Diagnosis not present

## 2019-07-04 DIAGNOSIS — F411 Generalized anxiety disorder: Secondary | ICD-10-CM | POA: Diagnosis not present

## 2019-07-04 DIAGNOSIS — Z23 Encounter for immunization: Secondary | ICD-10-CM | POA: Diagnosis not present

## 2019-07-18 DIAGNOSIS — F411 Generalized anxiety disorder: Secondary | ICD-10-CM | POA: Diagnosis not present

## 2019-07-18 DIAGNOSIS — F3289 Other specified depressive episodes: Secondary | ICD-10-CM | POA: Diagnosis not present

## 2019-07-23 DIAGNOSIS — R1031 Right lower quadrant pain: Secondary | ICD-10-CM | POA: Diagnosis not present

## 2019-07-24 ENCOUNTER — Other Ambulatory Visit: Payer: Self-pay

## 2019-07-24 ENCOUNTER — Emergency Department (HOSPITAL_COMMUNITY)
Admission: EM | Admit: 2019-07-24 | Discharge: 2019-07-24 | Disposition: A | Discharge status: Home / Self Care | Payer: BC Managed Care – PPO | Attending: Emergency Medicine | Admitting: Emergency Medicine

## 2019-07-24 ENCOUNTER — Encounter (HOSPITAL_COMMUNITY): Payer: Self-pay | Admitting: Emergency Medicine

## 2019-07-24 ENCOUNTER — Emergency Department (HOSPITAL_COMMUNITY): Payer: BC Managed Care – PPO

## 2019-07-24 DIAGNOSIS — R112 Nausea with vomiting, unspecified: Secondary | ICD-10-CM | POA: Insufficient documentation

## 2019-07-24 DIAGNOSIS — Z79899 Other long term (current) drug therapy: Secondary | ICD-10-CM | POA: Diagnosis not present

## 2019-07-24 DIAGNOSIS — R1011 Right upper quadrant pain: Secondary | ICD-10-CM | POA: Insufficient documentation

## 2019-07-24 DIAGNOSIS — R109 Unspecified abdominal pain: Secondary | ICD-10-CM | POA: Diagnosis not present

## 2019-07-24 LAB — CBC WITH DIFFERENTIAL/PLATELET
Abs Immature Granulocytes: 0.02 10*3/uL (ref 0.00–0.07)
Basophils Absolute: 0 10*3/uL (ref 0.0–0.1)
Basophils Relative: 0 %
Eosinophils Absolute: 0.1 10*3/uL (ref 0.0–0.5)
Eosinophils Relative: 1 %
HCT: 41 % (ref 36.0–46.0)
Hemoglobin: 14 g/dL (ref 12.0–15.0)
Immature Granulocytes: 0 %
Lymphocytes Relative: 23 %
Lymphs Abs: 1.7 10*3/uL (ref 0.7–4.0)
MCH: 31.7 pg (ref 26.0–34.0)
MCHC: 34.1 g/dL (ref 30.0–36.0)
MCV: 92.8 fL (ref 80.0–100.0)
Monocytes Absolute: 0.6 10*3/uL (ref 0.1–1.0)
Monocytes Relative: 8 %
Neutro Abs: 5 10*3/uL (ref 1.7–7.7)
Neutrophils Relative %: 68 %
Platelets: 242 10*3/uL (ref 150–400)
RBC: 4.42 MIL/uL (ref 3.87–5.11)
RDW: 11.9 % (ref 11.5–15.5)
WBC: 7.4 10*3/uL (ref 4.0–10.5)
nRBC: 0 % (ref 0.0–0.2)

## 2019-07-24 LAB — COMPREHENSIVE METABOLIC PANEL
ALT: 15 U/L (ref 0–44)
AST: 14 U/L — ABNORMAL LOW (ref 15–41)
Albumin: 3.6 g/dL (ref 3.5–5.0)
Alkaline Phosphatase: 53 U/L (ref 38–126)
Anion gap: 10 (ref 5–15)
BUN: 11 mg/dL (ref 6–20)
CO2: 21 mmol/L — ABNORMAL LOW (ref 22–32)
Calcium: 9 mg/dL (ref 8.9–10.3)
Chloride: 107 mmol/L (ref 98–111)
Creatinine, Ser: 0.7 mg/dL (ref 0.44–1.00)
GFR calc Af Amer: >60 mL/min (ref >60)
GFR calc non Af Amer: >60 mL/min (ref >60)
Glucose, Bld: 103 mg/dL — ABNORMAL HIGH (ref 70–99)
Potassium: 3.8 mmol/L (ref 3.5–5.1)
Sodium: 138 mmol/L (ref 135–145)
Total Bilirubin: 0.9 mg/dL (ref 0.3–1.2)
Total Protein: 6.3 g/dL — ABNORMAL LOW (ref 6.5–8.1)

## 2019-07-24 LAB — URINALYSIS, ROUTINE W REFLEX MICROSCOPIC
Bilirubin Urine: NEGATIVE
Glucose, UA: NEGATIVE mg/dL
Hgb urine dipstick: NEGATIVE
Ketones, ur: NEGATIVE mg/dL
Nitrite: NEGATIVE
Protein, ur: NEGATIVE mg/dL
Specific Gravity, Urine: 1.008 (ref 1.005–1.030)
pH: 6 (ref 5.0–8.0)

## 2019-07-24 LAB — I-STAT BETA HCG BLOOD, ED (MC, WL, AP ONLY): I-stat hCG, quantitative: <5 m[IU]/mL (ref <5)

## 2019-07-24 LAB — LIPASE, BLOOD: Lipase: 26 U/L (ref 11–51)

## 2019-07-24 MED ORDER — MORPHINE SULFATE (PF) 4 MG/ML IV SOLN
4.0000 mg | Freq: Once | INTRAVENOUS | Status: AC
  Administered 2019-07-24: 4 mg via INTRAVENOUS
  Filled 2019-07-24: qty 1

## 2019-07-24 MED ORDER — ONDANSETRON 4 MG PO TBDP: 4.0000 mg | ORAL_TABLET | Freq: Three times a day (TID) | ORAL | 0 refills | Status: DC | PRN

## 2019-07-24 MED ORDER — IOHEXOL 300 MG/ML  SOLN
100.0000 mL | Freq: Once | INTRAMUSCULAR | Status: AC | PRN
  Administered 2019-07-24: 100 mL via INTRAVENOUS

## 2019-07-24 MED ORDER — ONDANSETRON HCL 4 MG/2ML IJ SOLN
4.0000 mg | Freq: Once | INTRAMUSCULAR | Status: AC
  Administered 2019-07-24: 4 mg via INTRAVENOUS
  Filled 2019-07-24: qty 2

## 2019-07-24 NOTE — ED Provider Notes (Signed)
[facility] DEPARTMENT Provider Note   CSN: [id number] Arrival date & time: 07/24/19  [id number]     History   Chief Complaint Chief Complaint  Patient presents with  . Abdominal Pain    HPI Savannah Beck is a 28 y.o. female.     The history is provided by the patient and medical records.  Abdominal Pain Associated symptoms: diarrhea, nausea and vomiting      28 y.o. F with hx of ovarian cysts, hyperlipidemia, presenting to the ED with RLQ pain.  States this started on Sunday night and has been steadily worsening. Pain remains localized to right lower quadrant without radiation.  She denies any associated nausea, vomiting, diarrhea, fever, or chills.  Has been trying to eat and drink regularly.  She went to a walk-in clinic last night, had a point-of-care WBC done and UA that did not show any results.  She was given a shot of Toradol and sent home.  States she did not have any improvement of her pain it actually is worsening today.  No prior abdominal surgeries.  No meds prior to arrival.  Past Medical History:  Diagnosis Date  . Bilateral ovarian cysts   . Hyperlipidemia     Patient Active Problem List   Diagnosis Date Noted  . 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 ANKLE PAIN, RIGHT 07/01/2010  . OTHER ENTHESOPATHY OF ANKLE AND TARSUS 07/01/2010    Past Surgical History:  Procedure Laterality Date  . ANGIOMA CAUTERY Right      OB History   No obstetric history on file.      Home Medications    Prior to Admission medications   Medication Sig Start Date End Date Taking? Authorizing Provider  cetirizine (ZYRTEC) 10 MG tablet Take 10 mg by mouth daily.    [provider]  fluticasone (FLONASE) 50 MCG/ACT nasal spray Place 1 spray into both nostrils daily.    [provider]  ibuprofen (ADVIL,MOTRIN) 800 MG tablet Take 1 tablet (800 mg total) by mouth every 8 (eight) hours as needed. 09/20/16   Marcille Buffy  D, CNM  ondansetron (ZOFRAN) 4 MG tablet Take 1 tablet (4 mg total) by mouth every 6 (six) hours. 10/12/14   Rancour, Annie Main, MD  TRI-PREVIFEM 0.18/0.215/0.25 MG-35 MCG tablet Take 1 tablet by mouth daily. Reported on 12/16/2015 01/14/12   [provider]    Family History Family History  Problem Relation Age of Onset  . Thyroid disease Mother   . Diabetes Mother   . Cancer Father   . Diabetes Other   . CAD Other   . Alzheimer's disease Other   . Asthma Other   . Hypertension Other   . Breast cancer Paternal Grandmother     Social History Social History   Tobacco Use  . Smoking status: Never Smoker  . Smokeless tobacco: Never Used  Substance Use Topics  . Alcohol use: Yes    Alcohol/week: 0.0 standard drinks    Comment: once a month  . Drug use: No     Allergies   Penicillins   Review of Systems Review of Systems  Gastrointestinal: Positive for abdominal pain, diarrhea, nausea and vomiting.  All other systems reviewed and are negative.    Physical Exam Updated Vital Signs BP 103/66   Pulse 84   Temp 98.6 F (37 C) (Oral)   Resp 20   LMP 07/05/2019 (Exact Date)   SpO2 99%   Physical Exam Vitals signs and  nursing note reviewed.  Constitutional:      Appearance: She is well-developed.  HENT:     Head: Normocephalic and atraumatic.  Eyes:     Conjunctiva/sclera: Conjunctivae normal.     Pupils: Pupils are equal, round, and reactive to light.  Neck:     Musculoskeletal: Normal range of motion.  Cardiovascular:     Rate and Rhythm: Normal rate and regular rhythm.     Heart sounds: Normal heart sounds.  Pulmonary:     Effort: Pulmonary effort is normal.     Breath sounds: Normal breath sounds.  Abdominal:     General: Bowel sounds are normal.     Palpations: Abdomen is soft.     Tenderness: There is abdominal tenderness in the right lower quadrant. There is no guarding or rebound.    Musculoskeletal: Normal range of motion.  Skin:     General: Skin is warm and dry.  Neurological:     Mental Status: She is alert and oriented to person, place, and time.      ED Treatments / Results  Labs (all labs ordered are listed, but only abnormal results are displayed) Labs Reviewed  COMPREHENSIVE METABOLIC PANEL - Abnormal; Notable for the following components:      Result Value   CO2 21 (*)    Glucose, Bld 103 (*)    Total Protein 6.3 (*)    AST 14 (*)    All other components within normal limits  URINALYSIS, ROUTINE W REFLEX MICROSCOPIC - Abnormal; Notable for the following components:   Color, Urine STRAW (*)    Leukocytes,Ua SMALL (*)    Bacteria, UA RARE (*)    All other components within normal limits  CBC WITH DIFFERENTIAL/PLATELET  LIPASE, BLOOD  I-STAT BETA HCG BLOOD, ED ([facility], [facility], [facility] ONLY)    EKG None  Radiology Ct Abdomen Pelvis W Contrast  Result Date: 07/24/2019 CLINICAL DATA:  Abdominal pain, appendicitis suspected, right lower quadrant pain for 3 days EXAM: CT ABDOMEN AND PELVIS WITH CONTRAST TECHNIQUE: Multidetector CT imaging of the abdomen and pelvis was performed using the standard protocol following bolus administration of intravenous contrast. CONTRAST:  129mL OMNIPAQUE IOHEXOL 300 MG/ML  SOLN COMPARISON:  CT 03/22/2019 FINDINGS: Lower chest: Streaky basilar areas of atelectasis. Normal heart size. No pericardial effusion. Hepatobiliary: No focal liver abnormality is seen. There is some mild hazy fat stranding adjacent the tip of the gallbladder and hepatic flexure. No frank gallbladder wall thickening or colonic thickening is seen. No visible gallstones. No pericholecystic fluid. No biliary ductal dilatation. Pancreas: Unremarkable. No pancreatic ductal dilatation or surrounding inflammatory changes. Spleen: Normal in size without focal abnormality. Adrenals/Urinary Tract: Adrenal glands are unremarkable. Kidneys are normal, without renal calculi, focal lesion, or hydronephrosis. Bladder is unremarkable.  Stomach/Bowel: Distal esophagus, stomach and duodenal sweep are unremarkable. No small bowel wall thickening or dilatation. No evidence of obstruction. A normal air-filled appendix is visualized in the right lower quadrant (6/33). No periappendiceal inflammation. No colonic dilatation or wall thickening. Vascular/Lymphatic: The aorta is normal caliber. No suspicious or enlarged lymph nodes in the included lymphatic chains. Reproductive: Normal anteverted uterus. No concerning adnexal lesions. Other: No abdominopelvic free fluid or free gas. No bowel containing hernias. Musculoskeletal: No acute osseous abnormality or suspicious osseous lesion. IMPRESSION: 1. Very faint hazy fat stranding adjacent the tip of the gallbladder and hepatic flexure. No frank gallbladder wall thickening or pericholecystic fluid. No visible gallstones. If there is clinical concern for acute cholecystitis, consider further  evaluation with right upper quadrant ultrasound. 2. Normal air-filled appendix without periappendiceal inflammation 3. No other acute abnormality in the abdomen or pelvis. Electronically Signed   By: Lovena Le M.D.   On: 07/24/2019 06:32    Procedures Procedures (including critical care time)  Medications Ordered in ED Medications  morphine 4 MG/ML injection 4 mg (4 mg Intravenous Given 07/24/19 0504)  ondansetron (ZOFRAN) injection 4 mg (4 mg Intravenous Given 07/24/19 0504)  iohexol (OMNIPAQUE) 300 MG/ML solution 100 mL (100 mLs Intravenous Contrast Given 07/24/19 0606)     Initial Impression / Assessment and Plan / ED Course  I have reviewed the triage vital signs and the nursing notes.  Pertinent labs & imaging results that were available during my care of the patient were reviewed by me and considered in my medical decision making (see chart for details).  28 yo. F here with RLQ pain for the past few days.  Seen at walk in clinic last night, had istat WBC count and UA, given shot of toradol and  sent home.  States pain is worsening.  No radiation.  No urinary or pelvic symptoms.  She is afebrile, non-toxic.  Focal TTP in RLQ.  Labs pending.  Will get CT scan for further evaluation.  Morphine and zofran given.  Labs reassuring.  UA without significant signs of infection.  CT without findings of appendicitis or other abnormalities in the pelvis, however hazy fat stranding adjacent to gallbladder and hepatic flexure.  Will obtain US RUQ.  Care signed out to oncoming provider to follow-up on [place] and reassess.  If [place] reassuring and symptoms improved, can likely discharge home with PCP follow-up.  Final Clinical Impressions(s) / ED Diagnoses   Final diagnoses:  RUQ pain  Right sided abdominal pain    ED Discharge Orders    None       Larene Pickett, PA-C 07/24/19 [id number]    Ripley Fraise, MD 07/24/19 [phone]

## 2019-07-24 NOTE — ED Provider Notes (Signed)
RLQ pain onset Sunday, [facility], worsening pain, radiates up right side. CT shows normal appendix, recommends [place]. If [place] normal, can be dc. Physical Exam  BP 105/67 (BP Location: Right Arm)   Pulse 67   Temp 98.6 F (37 C) (Oral)   Resp 18   LMP 07/05/2019 (Exact Date)   SpO2 98%   Physical Exam Alert, awake, respirations even and unlabored. ED Course/Procedures     Procedures  MDM  Discussed results or CT and [place]. Reports ongoing pain in RLQ, no relief from the Morphine. Reports similar pain in the past, did see GI at that time but thought could be related to menstrual cycle. Advised to return to ER for any new or worsening symptoms. Given rx for Zofran, referral to GI if symptoms continue.        Tacy Learn, PA-C 07/24/19 [id number]    Isla Pence, MD 07/24/19 [phone]

## 2019-07-24 NOTE — ED Notes (Signed)
Patient transported to Ultrasound 

## 2019-07-24 NOTE — Discharge Instructions (Signed)
Take Zofran as needed as prescribed for nausea. Return to ER for new or worsening symptoms. Otherwise, follow up with your PCP or see GI.

## 2019-07-24 NOTE — ED Triage Notes (Signed)
Pt reports RLQ pain since Sunday that has gotten worse. States she went to a walk in clinic last night and was given toradol that did not help. Denies n/v/d, fever or chills.

## 2019-07-31 DIAGNOSIS — I890 Lymphedema, not elsewhere classified: Secondary | ICD-10-CM | POA: Diagnosis not present

## 2019-07-31 DIAGNOSIS — I872 Venous insufficiency (chronic) (peripheral): Secondary | ICD-10-CM | POA: Diagnosis not present

## 2019-08-19 ENCOUNTER — Other Ambulatory Visit: Payer: Self-pay

## 2019-08-19 ENCOUNTER — Ambulatory Visit
Admission: RE | Admit: 2019-08-19 | Discharge: 2019-08-19 | Disposition: A | Discharge status: Home / Self Care | Payer: BC Managed Care – PPO | Source: Ambulatory Visit | Attending: Obstetrics & Gynecology | Admitting: Obstetrics & Gynecology

## 2019-08-19 DIAGNOSIS — N6311 Unspecified lump in the right breast, upper outer quadrant: Secondary | ICD-10-CM | POA: Diagnosis not present

## 2019-08-19 DIAGNOSIS — N6310 Unspecified lump in the right breast, unspecified quadrant: Secondary | ICD-10-CM

## 2019-09-05 DIAGNOSIS — H16143 Punctate keratitis, bilateral: Secondary | ICD-10-CM | POA: Diagnosis not present

## 2019-10-16 DIAGNOSIS — F411 Generalized anxiety disorder: Secondary | ICD-10-CM | POA: Diagnosis not present

## 2019-10-16 DIAGNOSIS — F3289 Other specified depressive episodes: Secondary | ICD-10-CM | POA: Diagnosis not present

## 2019-12-03 DIAGNOSIS — F411 Generalized anxiety disorder: Secondary | ICD-10-CM | POA: Diagnosis not present

## 2019-12-03 DIAGNOSIS — F3289 Other specified depressive episodes: Secondary | ICD-10-CM | POA: Diagnosis not present

## 2019-12-09 DIAGNOSIS — H16143 Punctate keratitis, bilateral: Secondary | ICD-10-CM | POA: Diagnosis not present

## 2019-12-23 DIAGNOSIS — E785 Hyperlipidemia, unspecified: Secondary | ICD-10-CM | POA: Diagnosis not present

## 2019-12-23 DIAGNOSIS — R42 Dizziness and giddiness: Secondary | ICD-10-CM | POA: Diagnosis not present

## 2019-12-23 DIAGNOSIS — R109 Unspecified abdominal pain: Secondary | ICD-10-CM | POA: Diagnosis not present

## 2019-12-23 DIAGNOSIS — Z0000 Encounter for general adult medical examination without abnormal findings: Secondary | ICD-10-CM | POA: Diagnosis not present

## 2019-12-24 DIAGNOSIS — I890 Lymphedema, not elsewhere classified: Secondary | ICD-10-CM | POA: Diagnosis not present

## 2019-12-24 DIAGNOSIS — I83893 Varicose veins of bilateral lower extremities with other complications: Secondary | ICD-10-CM | POA: Diagnosis not present

## 2019-12-25 DIAGNOSIS — F3289 Other specified depressive episodes: Secondary | ICD-10-CM | POA: Diagnosis not present

## 2019-12-25 DIAGNOSIS — F411 Generalized anxiety disorder: Secondary | ICD-10-CM | POA: Diagnosis not present

## 2020-01-07 DIAGNOSIS — D1801 Hemangioma of skin and subcutaneous tissue: Secondary | ICD-10-CM | POA: Diagnosis not present

## 2020-01-07 DIAGNOSIS — D2371 Other benign neoplasm of skin of right lower limb, including hip: Secondary | ICD-10-CM | POA: Diagnosis not present

## 2020-01-07 DIAGNOSIS — L858 Other specified epidermal thickening: Secondary | ICD-10-CM | POA: Diagnosis not present

## 2020-01-07 DIAGNOSIS — D2261 Melanocytic nevi of right upper limb, including shoulder: Secondary | ICD-10-CM | POA: Diagnosis not present

## 2020-01-07 DIAGNOSIS — D2262 Melanocytic nevi of left upper limb, including shoulder: Secondary | ICD-10-CM | POA: Diagnosis not present

## 2020-01-15 DIAGNOSIS — I8312 Varicose veins of left lower extremity with inflammation: Secondary | ICD-10-CM | POA: Diagnosis not present

## 2020-01-15 DIAGNOSIS — R600 Localized edema: Secondary | ICD-10-CM | POA: Diagnosis not present

## 2020-01-15 DIAGNOSIS — I83893 Varicose veins of bilateral lower extremities with other complications: Secondary | ICD-10-CM | POA: Diagnosis not present

## 2020-01-15 DIAGNOSIS — I83892 Varicose veins of left lower extremities with other complications: Secondary | ICD-10-CM | POA: Diagnosis not present

## 2020-03-09 ENCOUNTER — Emergency Department (HOSPITAL_COMMUNITY)
Admission: EM | Admit: 2020-03-09 | Discharge: 2020-03-09 | Disposition: A | Discharge status: Home / Self Care | Payer: BC Managed Care – PPO | Attending: Emergency Medicine | Admitting: Emergency Medicine

## 2020-03-09 ENCOUNTER — Emergency Department (HOSPITAL_COMMUNITY): Payer: BC Managed Care – PPO

## 2020-03-09 ENCOUNTER — Encounter (HOSPITAL_COMMUNITY): Payer: Self-pay | Admitting: Emergency Medicine

## 2020-03-09 ENCOUNTER — Other Ambulatory Visit: Payer: Self-pay

## 2020-03-09 DIAGNOSIS — R0789 Other chest pain: Secondary | ICD-10-CM | POA: Insufficient documentation

## 2020-03-09 DIAGNOSIS — R1084 Generalized abdominal pain: Secondary | ICD-10-CM | POA: Diagnosis not present

## 2020-03-09 DIAGNOSIS — R1031 Right lower quadrant pain: Secondary | ICD-10-CM | POA: Diagnosis not present

## 2020-03-09 DIAGNOSIS — N926 Irregular menstruation, unspecified: Secondary | ICD-10-CM | POA: Diagnosis not present

## 2020-03-09 DIAGNOSIS — Z79899 Other long term (current) drug therapy: Secondary | ICD-10-CM | POA: Insufficient documentation

## 2020-03-09 DIAGNOSIS — R109 Unspecified abdominal pain: Secondary | ICD-10-CM | POA: Diagnosis not present

## 2020-03-09 DIAGNOSIS — R079 Chest pain, unspecified: Secondary | ICD-10-CM | POA: Diagnosis not present

## 2020-03-09 DIAGNOSIS — R1032 Left lower quadrant pain: Secondary | ICD-10-CM | POA: Insufficient documentation

## 2020-03-09 LAB — CBC
HCT: 42.9 % (ref 36.0–46.0)
Hemoglobin: 14.2 g/dL (ref 12.0–15.0)
MCH: 31 pg (ref 26.0–34.0)
MCHC: 33.1 g/dL (ref 30.0–36.0)
MCV: 93.7 fL (ref 80.0–100.0)
Platelets: 242 10*3/uL (ref 150–400)
RBC: 4.58 MIL/uL (ref 3.87–5.11)
RDW: 12.2 % (ref 11.5–15.5)
WBC: 6.9 10*3/uL (ref 4.0–10.5)
nRBC: 0 % (ref 0.0–0.2)

## 2020-03-09 LAB — WET PREP, GENITAL
Clue Cells Wet Prep HPF POC: NONE SEEN
Sperm: NONE SEEN
Trich, Wet Prep: NONE SEEN
Yeast Wet Prep HPF POC: NONE SEEN

## 2020-03-09 LAB — COMPREHENSIVE METABOLIC PANEL
ALT: 16 U/L (ref 0–44)
AST: 18 U/L (ref 15–41)
Albumin: 3.9 g/dL (ref 3.5–5.0)
Alkaline Phosphatase: 54 U/L (ref 38–126)
Anion gap: 11 (ref 5–15)
BUN: 8 mg/dL (ref 6–20)
CO2: 22 mmol/L (ref 22–32)
Calcium: 8.8 mg/dL — ABNORMAL LOW (ref 8.9–10.3)
Chloride: 107 mmol/L (ref 98–111)
Creatinine, Ser: 0.71 mg/dL (ref 0.44–1.00)
GFR calc Af Amer: >60 mL/min (ref >60)
GFR calc non Af Amer: >60 mL/min (ref >60)
Glucose, Bld: 112 mg/dL — ABNORMAL HIGH (ref 70–99)
Potassium: 3.6 mmol/L (ref 3.5–5.1)
Sodium: 140 mmol/L (ref 135–145)
Total Bilirubin: 0.7 mg/dL (ref 0.3–1.2)
Total Protein: 6.6 g/dL (ref 6.5–8.1)

## 2020-03-09 LAB — URINALYSIS, ROUTINE W REFLEX MICROSCOPIC
Bilirubin Urine: NEGATIVE
Glucose, UA: NEGATIVE mg/dL
Ketones, ur: NEGATIVE mg/dL
Leukocytes,Ua: NEGATIVE
Nitrite: NEGATIVE
Protein, ur: NEGATIVE mg/dL
Specific Gravity, Urine: 1.002 — ABNORMAL LOW (ref 1.005–1.030)
pH: 6 (ref 5.0–8.0)

## 2020-03-09 LAB — I-STAT BETA HCG BLOOD, ED (MC, WL, AP ONLY): I-stat hCG, quantitative: <5 m[IU]/mL (ref <5)

## 2020-03-09 LAB — TROPONIN I (HIGH SENSITIVITY)
Troponin I (High Sensitivity): <2 ng/L (ref <18)
Troponin I (High Sensitivity): <2 ng/L (ref <18)

## 2020-03-09 LAB — LIPASE, BLOOD: Lipase: 24 U/L (ref 11–51)

## 2020-03-09 MED ORDER — OMEPRAZOLE 20 MG PO CPDR
20.0000 mg | DELAYED_RELEASE_CAPSULE | Freq: Every day | ORAL | 0 refills | Status: DC
Start: 2020-03-09 — End: 2020-12-03

## 2020-03-09 MED ORDER — LIDOCAINE VISCOUS HCL 2 % MT SOLN
15.0000 mL | OROMUCOSAL | 0 refills | Status: DC | PRN
Start: 2020-03-09 — End: 2021-02-03

## 2020-03-09 MED ORDER — LIDOCAINE VISCOUS HCL 2 % MT SOLN
15.0000 mL | Freq: Once | OROMUCOSAL | Status: AC
  Administered 2020-03-09: 15 mL via ORAL
  Filled 2020-03-09: qty 15

## 2020-03-09 MED ORDER — IOHEXOL 300 MG/ML  SOLN
100.0000 mL | Freq: Once | INTRAMUSCULAR | Status: AC | PRN
  Administered 2020-03-09: 100 mL via INTRAVENOUS

## 2020-03-09 MED ORDER — SODIUM CHLORIDE 0.9% FLUSH: 3.0000 mL | Freq: Once | INTRAVENOUS | Status: DC

## 2020-03-09 MED ORDER — FAMOTIDINE IN NACL 20-0.9 MG/50ML-% IV SOLN
20.0000 mg | Freq: Once | INTRAVENOUS | Status: AC
  Administered 2020-03-09: 20 mg via INTRAVENOUS
  Filled 2020-03-09: qty 50

## 2020-03-09 MED ORDER — ALUM & MAG HYDROXIDE-SIMETH 200-200-20 MG/5ML PO SUSP
30.0000 mL | Freq: Once | ORAL | Status: AC
  Administered 2020-03-09: 30 mL via ORAL
  Filled 2020-03-09: qty 30

## 2020-03-09 MED ORDER — SODIUM CHLORIDE 0.9 % IV BOLUS: 1000.0000 mL | Freq: Once | INTRAVENOUS | Status: DC

## 2020-03-09 MED ORDER — DICYCLOMINE HCL 20 MG PO TABS
20.0000 mg | ORAL_TABLET | Freq: Two times a day (BID) | ORAL | 0 refills | Status: DC
Start: 2020-03-09 — End: 2020-12-03

## 2020-03-09 NOTE — ED Provider Notes (Signed)
[facility] EMERGENCY DEPARTMENT Provider Note   CSN: [id number] Arrival date & time: 03/09/20  0543     History Chief Complaint  Patient presents with  . Abdominal Pain    Savannah Beck is a 29 y.o. female.  HPI  Patient is a 29 year old female with past medical history significant for ovarian cysts, hyperlipidemia, abdominal pain.  Patient states that she is presenting today for symptoms of left shoulder pain, intermittent chest pain, abdominal pain that includes the right upper, left upper, right lower quadrants.  She states that her symptoms began on Wednesday.  She states that this is also when she started her period.  Patient describes her right lower quadrant abdominal pain as severe, 10/10, achy and at times stabbing.  She denies any aggravating or mitigating factors.  She states she took 1 dose of 2 tablets of Motrin yesterday with no relief.  She denies any nausea or vomiting.  She denies any chest pain or shortness of breath currently.  She denies any history of blood clots, no recent surgeries or immobilization, no unilateral leg swelling no hemoptysis.  She states that her last episode of chest pain was 1 day ago.  She states she has a history of ovarian cysts and states that they have been very painful in the past.     Past Medical History:  Diagnosis Date  . Bilateral ovarian cysts   . Hyperlipidemia     Patient Active Problem List   Diagnosis Date Noted  . 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 ANKLE PAIN, RIGHT 07/01/2010  . OTHER ENTHESOPATHY OF ANKLE AND TARSUS 07/01/2010    Past Surgical History:  Procedure Laterality Date  . ANGIOMA CAUTERY Right      OB History   No obstetric history on file.     Family History  Problem Relation Age of Onset  . Thyroid disease Mother   . Diabetes Mother   . Cancer Father   . Diabetes Other   . CAD Other   . Alzheimer's disease Other   . Asthma Other   . Hypertension Other   .  Breast cancer Paternal Grandmother     Social History   Tobacco Use  . Smoking status: Never Smoker  . Smokeless tobacco: Never Used  Substance Use Topics  . Alcohol use: Yes    Alcohol/week: 0.0 standard drinks    Comment: once a month  . Drug use: No    Home Medications Prior to Admission medications   Medication Sig Start Date End Date Taking? Authorizing Provider  cetirizine (ZYRTEC) 10 MG tablet Take 10 mg by mouth daily.    [provider]  dicyclomine (BENTYL) 20 MG tablet Take 1 tablet (20 mg total) by mouth 2 (two) times daily. 03/09/20   Azlan Hanway, Kathleene Hazel, PA  fluticasone (FLONASE) 50 MCG/ACT nasal spray Place 1-2 sprays into both nostrils daily as needed for allergies or rhinitis.     [provider]  hydrOXYzine (ATARAX/VISTARIL) 50 MG tablet Take 50 mg by mouth daily as needed for anxiety.    [provider]  ibuprofen (ADVIL) 200 MG tablet Take 200-600 mg by mouth every 6 (six) hours as needed for mild pain.    [provider]  ibuprofen (ADVIL,MOTRIN) 800 MG tablet Take 1 tablet (800 mg total) by mouth every 8 (eight) hours as needed. Patient not taking: Reported on 07/24/2019 09/20/16   Marcille Buffy D, CNM  lidocaine (XYLOCAINE) 2 % solution Use as directed  15 mLs in the mouth or throat as needed for mouth pain. 03/09/20   Tedd Sias, PA  Norgestimate-Ethinyl Estradiol Triphasic (TRI-ESTARYLLA) 0.18/0.215/0.25 MG-35 MCG tablet Take 1 tablet by mouth daily.    [provider]  omeprazole (PRILOSEC) 20 MG capsule Take 1 capsule (20 mg total) by mouth daily for 14 days. 03/09/20 03/23/20  Tedd Sias, PA  ondansetron (ZOFRAN ODT) 4 MG disintegrating tablet Take 1 tablet (4 mg total) by mouth every 8 (eight) hours as needed for nausea or vomiting. 07/24/19   Tacy Learn, PA-C  ondansetron (ZOFRAN) 4 MG tablet Take 1 tablet (4 mg total) by mouth every 6 (six) hours. Patient not taking: Reported on 07/24/2019 10/12/14    Ezequiel Essex, MD    Allergies    Other, Amoxicillin, and Penicillins  Review of Systems   Review of Systems  Constitutional: Negative for chills and fever.  HENT: Negative for congestion.   Eyes: Negative for pain.  Respiratory: Negative for cough and shortness of breath.   Cardiovascular: Negative for chest pain and leg swelling.  Gastrointestinal: Positive for abdominal pain. Negative for nausea and vomiting.  Genitourinary: Positive for pelvic pain and vaginal bleeding. Negative for decreased urine volume, dysuria and vaginal discharge.  Musculoskeletal: Negative for myalgias.  Skin: Negative for rash.  Neurological: Negative for dizziness and headaches.    Physical Exam Updated Vital Signs BP 136/87 (BP Location: Right Arm)   Pulse 98   Temp 98.9 F (37.2 C) (Oral)   Resp 16   Ht 4\' 10"  (1.473 m)   Wt 77.1 kg   SpO2 100%   BMI 35.53 kg/m   Physical Exam Vitals and nursing note reviewed. Exam conducted with a chaperone present.  Constitutional:      General: She is not in acute distress.    Comments: Patient is 29 year old female appears somewhat older than stated age, obese, anxious appearing, pleasant, able to answer questions appropriately follow commands  HENT:     Head: Normocephalic and atraumatic.     Nose: Nose normal.     Mouth/Throat:     Mouth: Mucous membranes are moist.  Eyes:     General: No scleral icterus. Cardiovascular:     Rate and Rhythm: Normal rate and regular rhythm.     Pulses: Normal pulses.     Heart sounds: Normal heart sounds.     Comments: Pulses 3+ and symmetric in bilateral upper and lower extremities Pulmonary:     Effort: Pulmonary effort is normal. No respiratory distress.     Breath sounds: Normal breath sounds. No wheezing.  Abdominal:     Palpations: Abdomen is soft.     Tenderness: There is abdominal tenderness. There is no right CVA tenderness, left CVA tenderness, guarding or rebound.     Comments: Tenderness to  palpation of the right lower quadrant over McBurney's point.  There is also pain with percussion over McBurney's point.  There is also diffuse tenderness to palpation in the left lower quadrant, suprapubic, right upper quadrant and epigastrium areas.   No guarding or rigidity.  Abdomen is soft bowel sounds are normal.  Genitourinary:    Comments: Vulva without lesions or abnormality Vaginal canal without abnormal discharge or lesion-there is blood present in the vaginal vault. Cervix appears normal, is closed No adnexal tenderness or CMT  Musculoskeletal:     Cervical back: Normal range of motion.     Right lower leg: No edema.     Left lower  leg: No edema.     Comments: No calf tenderness, calves are equal and symmetric.  Skin:    General: Skin is warm and dry.     Capillary Refill: Capillary refill takes less than 2 seconds.  Neurological:     Mental Status: She is alert. Mental status is at baseline.  Psychiatric:        Mood and Affect: Mood normal.        Behavior: Behavior normal.     Comments: Anxious     ED Results / Procedures / Treatments   Labs (all labs ordered are listed, but only abnormal results are displayed) Labs Reviewed  WET PREP, GENITAL - Abnormal; Notable for the following components:      Result Value   WBC, Wet Prep HPF POC MODERATE (*)    All other components within normal limits  COMPREHENSIVE METABOLIC PANEL - Abnormal; Notable for the following components:   Glucose, Bld 112 (*)    Calcium 8.8 (*)    All other components within normal limits  URINALYSIS, ROUTINE W REFLEX MICROSCOPIC - Abnormal; Notable for the following components:   Color, Urine STRAW (*)    Specific Gravity, Urine 1.002 (*)    Hgb urine dipstick MODERATE (*)    Bacteria, UA RARE (*)    All other components within normal limits  LIPASE, BLOOD  CBC  I-STAT BETA HCG BLOOD, ED ([facility], [facility], [facility] ONLY)  I-STAT BETA HCG BLOOD, ED ([facility], [facility], [facility] ONLY)  GC/CHLAMYDIA PROBE AMP ([place])  NOT AT [facility]  TROPONIN I (HIGH SENSITIVITY)  TROPONIN I (HIGH SENSITIVITY)    EKG EKG Interpretation  Date/Time:  Monday March 09 2020 06:00:23 EDT Ventricular Rate:  97 PR Interval:  128 QRS Duration: 80 QT Interval:  358 QTC Calculation: 454 R Axis:   68 Text Interpretation: Normal sinus rhythm Nonspecific ST abnormality Abnormal ECG No significant change since prior 2/11 Confirmed by Aletta Edouard [phone]) on 03/09/2020 8:24:12 AM   Radiology DG Chest 2 View  Result Date: 03/09/2020 CLINICAL DATA:  Chest and abdominal pain. EXAM: CHEST - 2 VIEW COMPARISON:  03/22/2019 FINDINGS: The cardiac silhouette, mediastinal and hilar contours are normal. The lungs are clear. No pleural effusions. The bony thorax is intact. IMPRESSION: No acute cardiopulmonary findings. Electronically Signed   By: Marijo Sanes M.D.   On: 03/09/2020 06:38   CT ABDOMEN PELVIS W CONTRAST  Result Date: 03/09/2020 CLINICAL DATA:  Right lower quadrant abdominal pain. EXAM: CT ABDOMEN AND PELVIS WITH CONTRAST TECHNIQUE: Multidetector CT imaging of the abdomen and pelvis was performed using the standard protocol following bolus administration of intravenous contrast. CONTRAST:  160mL OMNIPAQUE IOHEXOL 300 MG/ML  SOLN COMPARISON:  07/24/2019. FINDINGS: Lower chest: No acute findings. Heart size normal. No pericardial or pleural effusion. Distal esophagus is unremarkable. Hepatobiliary: Liver and gallbladder are unremarkable. No biliary ductal dilatation. Pancreas: Negative. Spleen: Negative. Adrenals/Urinary Tract: Adrenal glands are unremarkable. There may be mild right caliectasis. Kidneys are otherwise unremarkable. Ureters are decompressed. Bladder is grossly unremarkable. Stomach/Bowel: Stomach, small bowel, appendix and colon are unremarkable. Vascular/Lymphatic: Vascular structures are unremarkable. No pathologically enlarged lymph nodes. Reproductive: Uterus is visualized.  No adnexal mass. Other: No free fluid.   Mesenteries and peritoneum are unremarkable. Musculoskeletal: No worrisome lytic or sclerotic lesions. IMPRESSION: No findings to explain the patient's abdominal pain. Electronically Signed   By: Lorin Picket M.D.   On: 03/09/2020 10:08    Procedures Procedures (including critical care time)  Medications Ordered  in ED Medications  sodium chloride flush (NS) 0.9 % injection 3 mL (has no administration in time range)  sodium chloride flush (NS) 0.9 % injection 3 mL (has no administration in time range)  sodium chloride 0.9 % bolus 1,000 mL (has no administration in time range)  famotidine (PEPCID) IVPB 20 mg premix (0 mg Intravenous Stopped 03/09/20 1025)  alum & mag hydroxide-simeth (MAALOX/MYLANTA) 200-200-20 MG/5ML suspension 30 mL (30 mLs Oral Given 03/09/20 0917)    And  lidocaine (XYLOCAINE) 2 % viscous mouth solution 15 mL (15 mLs Oral Given 03/09/20 0917)  iohexol (OMNIPAQUE) 300 MG/ML solution 100 mL (100 mLs Intravenous Contrast Given 03/09/20 0944)    ED Course  I have reviewed the triage vital signs and the nursing notes.  Pertinent labs & imaging results that were available during my care of the patient were reviewed by me and considered in my medical decision making (see chart for details).  Patient with abdominal pain for several days.  It is severe right lower quadrant although appears to be also present and diffuse to the abdomen as well.  Physical exam is notable for findings of tenderness to palpation in the right lower quadrant with percussion there is also diffuse tenderness to palpation most notable in the left lower quadrant suprapubic and right upper quadrant and epigastric area.  The causes of generalized abdominal pain include but are not limited to AAA, mesenteric ischemia, appendicitis, diverticulitis, DKA, gastritis, gastroenteritis, AMI, nephrolithiasis, pancreatitis, peritonitis, adrenal insufficiency,lead poisoning, iron toxicity, intestinal ischemia,  constipation, UTI,SBO/LBO, splenic rupture, biliary disease, IBD, IBS, PUD, or hepatitis. Ectopic pregnancy, ovarian torsion, PID.  Low suspicion for ectopic pregnancy has here hCG is negative.  Doubt AAA, mesenteric ischemic, DKA.  High suspicion for gastritis or reflux for her chest pain and abdominal pain.  She does however have right lower quadrant tenderness concerning for appendicitis.  On my review of EMR it appears that she has had frequent visits for similar symptoms however her exam is concerning for appendicitis and I will obtain CT with contrast to rule this out.  Patient is agreeable to plan.  Shared decision-making conversation with patient she would prefer to defer pelvic ultrasound this time.  She is reassuring physical exam.  Doubt torsion.  Clinical Course as of Mar 09 1033  Mon Mar 09, 2020  1191 Urinalysis without any significant evidence of infection.  There is moderate hemoglobin secondary to patient being on her external cycle currently.  She denies any urinary urgency or dysuria.  She states that she has been peeing frequently however states that she has been drinking plenty of water.  She had a low specific gravity consistent with this.  Urinalysis, Routine w reflex microscopic(!) [WF]  0940 CBC without leukocytosis or anemia.  Lipase within normal limits at 24.   [WF]  0940 CMP without acute abnormality.  Troponin X1 within normal limits/undetectable.  I-STAT hCG negative for pregnancy.  Wet prep with no notable abnormalities moderate WBCs may be secondary to her menstrual cycle/reactive.  No evidence of STDs per pt with no vaginal pain or DC.    [WF]  0941 2 view chest x-ray without any acute abnormalities.  No infiltrate, pneumothorax or other unusual findings.  I agree of radiology read.   [WF]  4782 CT scan reviewed by myself agree with radiology read no acute findings.  Correlated CT scan with my physical exam.  I have very low suspicion for torsion.  At shared  decision-making physician patient she will  follow-up with her OB/GYN and a gastroenterologist for further evaluation of her abdominal pain.  I have some concern for this being endometriosis given the chronicity and lack of other explanation.   [WF]  1014 Patient fluid challenge.  She is tolerating p.o.  States that her pain is somewhat improved.  She understands discharge instructions and will follow up with her PCP as well.  Patient given return precautions.   [WF]  7268 29 year old female complaining of chest and abdominal pain going on the last week or so.  Worsening.  History of lower abdominal pain on and off for years that is not been clearly diagnosed.  She is got some diffuse tenderness on exam.  Lab work unremarkable.  CT unremarkable.  Likely discharge and have follow-up with PCP with consideration for gynecology and GI as outpatient.   [MB]    Clinical Course User Index [MB] Hayden Rasmussen, MD [WF] Tedd Sias, [place]   MDM Rules/Calculators/A&P                         I discussed this case with my attending physician who cosigned this note including patient's presenting symptoms, physical exam, and planned diagnostics and interventions. Attending physician stated agreement with plan or made changes to plan which were implemented.   Attending physician assessed patient at bedside.  I discussed the results of the patient's work-up with the patient and her partner who is at bedside.  Plan is to discharge with follow-up with primary care doctor.  I informed her that she would probably benefit from Bentyl for cramping, omeprazole and she seems to be having some severe month which may be consistent with endometriosis.  Recommend close follow-up with OB/GYN.  Also provided her with gastroenterology follow-up at her request.  She will use lidocaine as needed for burning in her throat.  No evidence of infection in her throat.  Low suspicion for candidiasis.  She has no immunocompromise.  No  history of HIV.  Physical exam is not consistent with thrush or visible esophageal/pharyngeal Candida.  Patient is agreeable to plan for discharge.  States that her pain is very minimal at this time.  Repeat abdominal exam is unchanged.  Final Clinical Impression(s) / ED Diagnoses Final diagnoses:  RLQ abdominal pain  LLQ abdominal pain  Abdominal pain, generalized  Menstrual periods irregular  Chest pain, unspecified type    Rx / DC Orders ED Discharge Orders         Ordered    omeprazole (PRILOSEC) 20 MG capsule  Daily     Discontinue  Reprint     03/09/20 1032    dicyclomine (BENTYL) 20 MG tablet  2 times daily     Discontinue  Reprint     03/09/20 1032    lidocaine (XYLOCAINE) 2 % solution  As needed     Discontinue  Reprint     03/09/20 [address] [place], [place] 03/09/20 1039    Hayden Rasmussen, MD 03/09/20 1825

## 2020-03-09 NOTE — ED Triage Notes (Addendum)
Pt presents to ED POV. Pt c/o RUQ, LUQ, abd pain,epigastric pain, L shoulder pain that radiates to L arm, and R ovary pain. Pt reports it began on wed. Pt reports irregular periods.

## 2020-03-09 NOTE — Discharge Instructions (Addendum)
Please call and make an appointment with your OB/GYN for reevaluation.  As we discussed it may be worth talking about the possibility of endometriosis--which will not show up on a CT scan typically.  In the meantime please drink plenty of water, eat 3 meals per day.  Use the omeprazole as prescribed.  You will take this for 14 days.  Please use Bentyl as needed for pain.  You may also take Tylenol.  You may use this 1000 mg every 6 hours.  I recommend against using ibuprofen or Motrin.  Please follow-up with the gastroenterologist.

## 2020-03-10 LAB — GC/CHLAMYDIA PROBE AMP (~~LOC~~) NOT AT ARMC
Chlamydia: NEGATIVE
Comment: NEGATIVE
Comment: NORMAL
Neisseria Gonorrhea: NEGATIVE

## 2020-03-20 DIAGNOSIS — R109 Unspecified abdominal pain: Secondary | ICD-10-CM | POA: Diagnosis not present

## 2020-03-20 DIAGNOSIS — R7309 Other abnormal glucose: Secondary | ICD-10-CM | POA: Diagnosis not present

## 2020-03-20 DIAGNOSIS — J309 Allergic rhinitis, unspecified: Secondary | ICD-10-CM | POA: Diagnosis not present

## 2020-04-03 DIAGNOSIS — F411 Generalized anxiety disorder: Secondary | ICD-10-CM | POA: Diagnosis not present

## 2020-04-03 DIAGNOSIS — K589 Irritable bowel syndrome without diarrhea: Secondary | ICD-10-CM | POA: Diagnosis not present

## 2020-04-03 DIAGNOSIS — F3289 Other specified depressive episodes: Secondary | ICD-10-CM | POA: Diagnosis not present

## 2020-04-03 DIAGNOSIS — K219 Gastro-esophageal reflux disease without esophagitis: Secondary | ICD-10-CM | POA: Diagnosis not present

## 2020-04-12 IMAGING — US US BREAST*R* LIMITED INC AXILLA
1 series · 4 of 4 positions shown · non-contrast
Comparison: Previous exam(s).

CLINICAL DATA: Two year interval follow-up of the likely benign
RIGHT breast mass, likely a fibroadenoma.

EXAM:
ULTRASOUND OF THE RIGHT BREAST

[Series 1: us breast*right* limited inc axilla · 0.06mm/px · 4 of 4 slices shown]
[im 1/4]
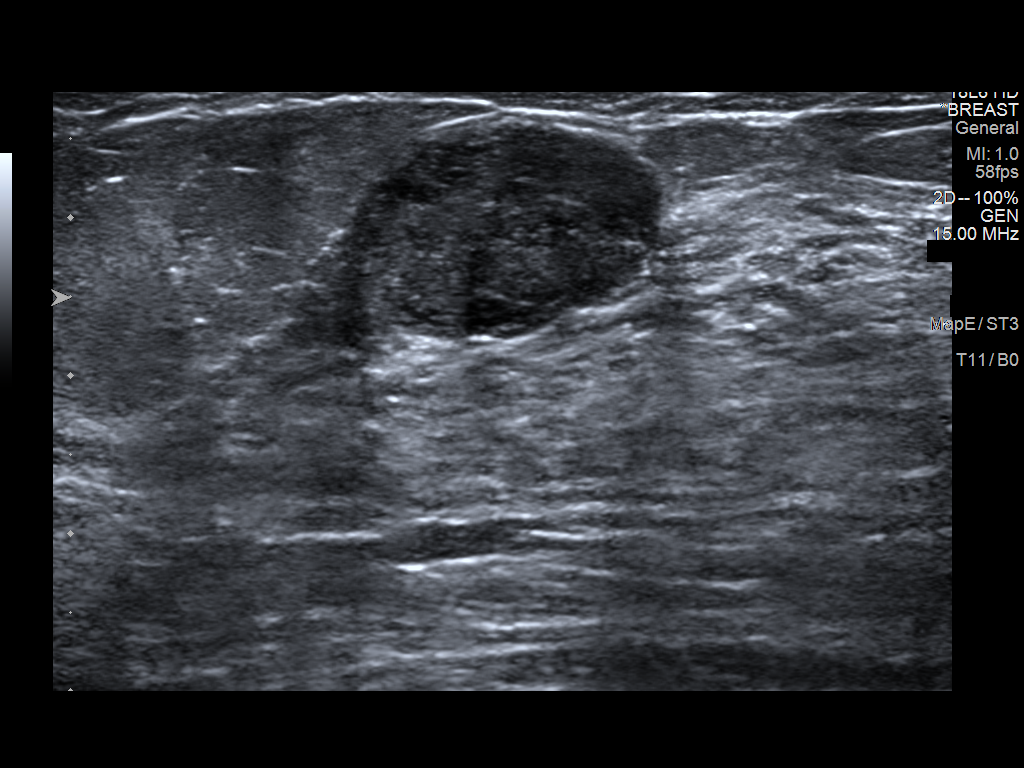
[im 2/4]
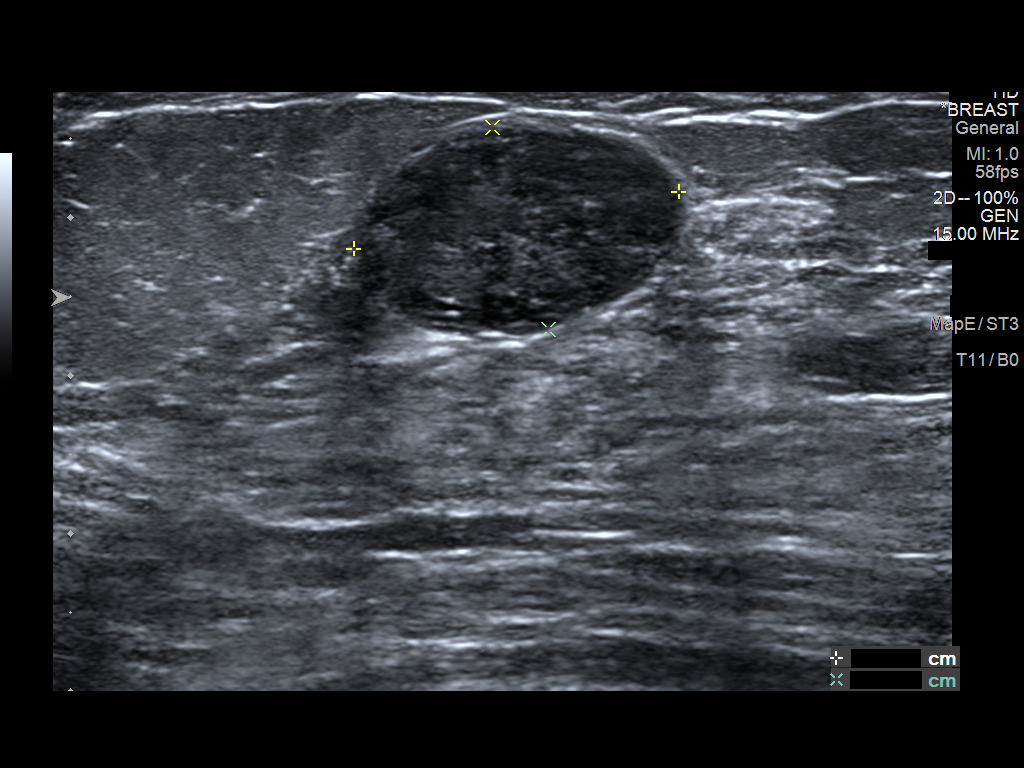
[im 3/4]
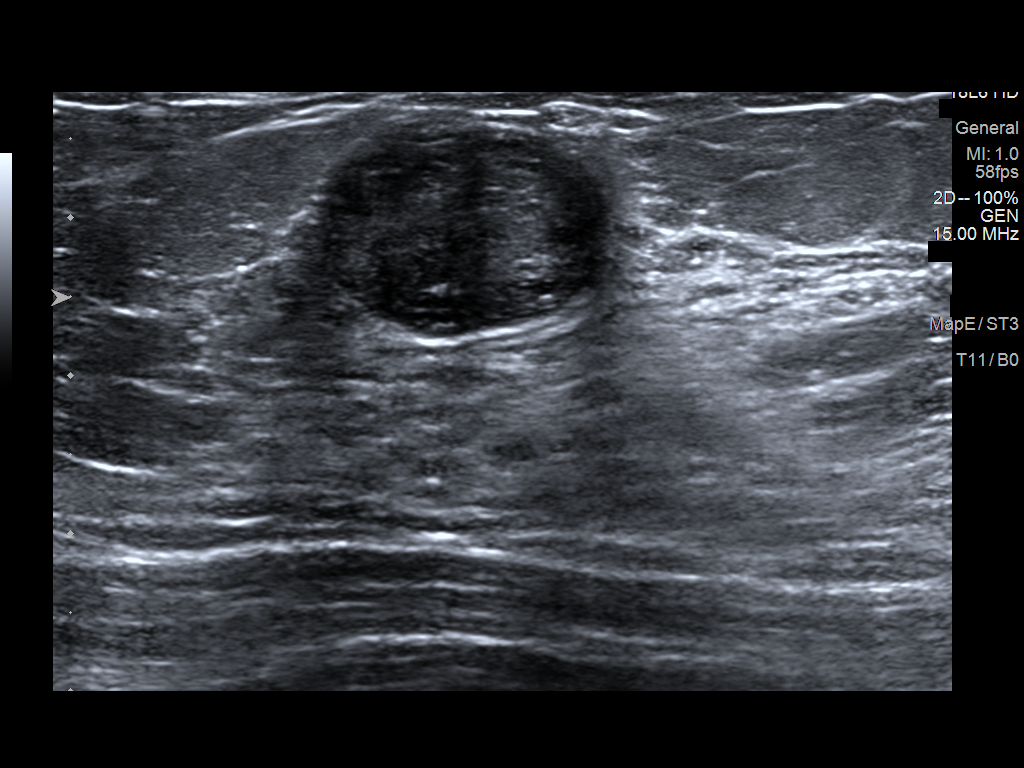
[im 4/4]
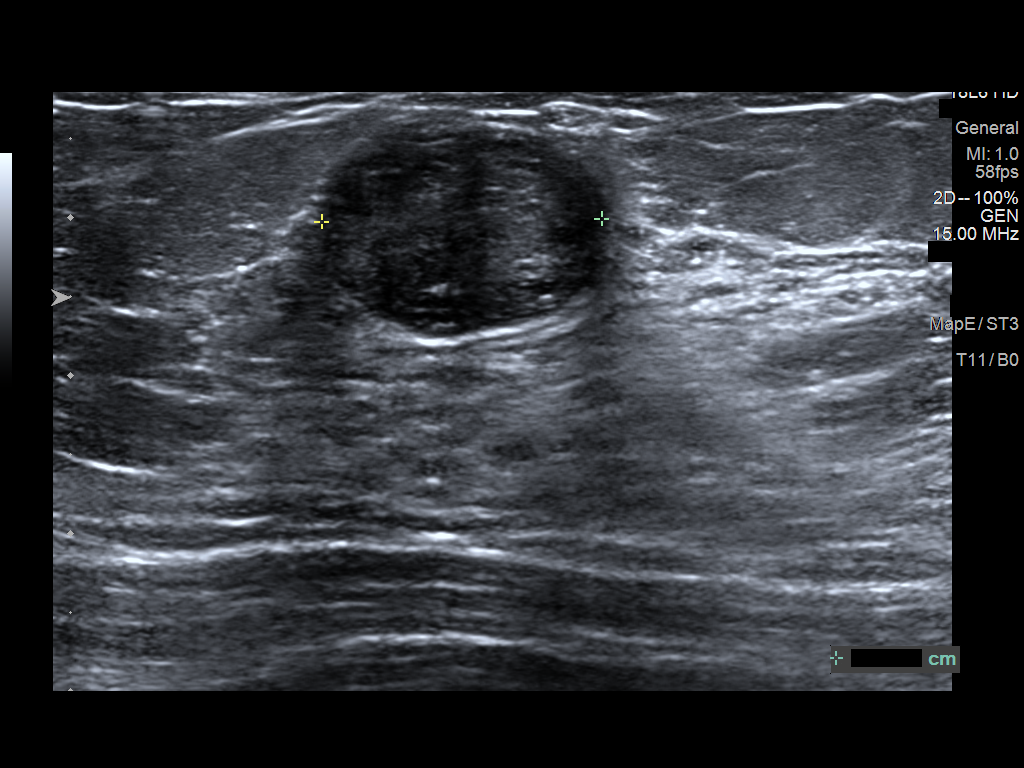

[4 of 4 positions shown; findings below may reference images not displayed]

FINDINGS: Targeted ultrasound is performed, showing that the previously
identified circumscribed oval parallel hypoechoic mass at the 11
o'clock position approximately 4 cm from the nipple is unchanged,
currently measuring approximately 1.3 x 2.1 x 1.8 cm (previously
x 2.1 x 1.7 cm on 08/16/2017).
IMPRESSION: Benign mass involving the UPPER OUTER QUADRANT of the RIGHT breast,
dating back to July 2017, confirming benignity, likely a
fibroadenoma.

RECOMMENDATION:
As the RIGHT breast mass has been stable for 2 years, no further
imaging follow-up is necessary. Screening mammogram at age 38 unless
there are persistent or intervening clinical concerns.
(Code:4W-0-LSO)

I have discussed the findings and recommendations with the patient.
If applicable, a reminder letter will be sent to the patient
regarding the next appointment.

BI-RADS CATEGORY  2: Benign.

## 2020-04-16 DIAGNOSIS — Z113 Encounter for screening for infections with a predominantly sexual mode of transmission: Secondary | ICD-10-CM | POA: Diagnosis not present

## 2020-04-16 DIAGNOSIS — R102 Pelvic and perineal pain: Secondary | ICD-10-CM | POA: Diagnosis not present

## 2020-04-27 DIAGNOSIS — R82998 Other abnormal findings in urine: Secondary | ICD-10-CM | POA: Diagnosis not present

## 2020-04-27 DIAGNOSIS — N2889 Other specified disorders of kidney and ureter: Secondary | ICD-10-CM | POA: Diagnosis not present

## 2020-04-27 DIAGNOSIS — K219 Gastro-esophageal reflux disease without esophagitis: Secondary | ICD-10-CM | POA: Diagnosis not present

## 2020-04-27 DIAGNOSIS — R109 Unspecified abdominal pain: Secondary | ICD-10-CM | POA: Diagnosis not present

## 2020-04-28 ENCOUNTER — Other Ambulatory Visit: Payer: Self-pay | Admitting: Nephrology

## 2020-04-28 DIAGNOSIS — N2889 Other specified disorders of kidney and ureter: Secondary | ICD-10-CM

## 2020-04-29 DIAGNOSIS — F411 Generalized anxiety disorder: Secondary | ICD-10-CM | POA: Diagnosis not present

## 2020-04-29 DIAGNOSIS — F3289 Other specified depressive episodes: Secondary | ICD-10-CM | POA: Diagnosis not present

## 2020-05-06 DIAGNOSIS — R109 Unspecified abdominal pain: Secondary | ICD-10-CM | POA: Diagnosis not present

## 2020-05-06 DIAGNOSIS — K589 Irritable bowel syndrome without diarrhea: Secondary | ICD-10-CM | POA: Diagnosis not present

## 2020-05-06 DIAGNOSIS — K219 Gastro-esophageal reflux disease without esophagitis: Secondary | ICD-10-CM | POA: Diagnosis not present

## 2020-05-07 DIAGNOSIS — Z6837 Body mass index (BMI) 37.0-37.9, adult: Secondary | ICD-10-CM | POA: Diagnosis not present

## 2020-05-07 DIAGNOSIS — R102 Pelvic and perineal pain: Secondary | ICD-10-CM | POA: Diagnosis not present

## 2020-05-07 DIAGNOSIS — Z01419 Encounter for gynecological examination (general) (routine) without abnormal findings: Secondary | ICD-10-CM | POA: Diagnosis not present

## 2020-05-08 DIAGNOSIS — F411 Generalized anxiety disorder: Secondary | ICD-10-CM | POA: Diagnosis not present

## 2020-05-08 DIAGNOSIS — F3289 Other specified depressive episodes: Secondary | ICD-10-CM | POA: Diagnosis not present

## 2020-05-15 DIAGNOSIS — R05 Cough: Secondary | ICD-10-CM | POA: Diagnosis not present

## 2020-05-15 DIAGNOSIS — F411 Generalized anxiety disorder: Secondary | ICD-10-CM | POA: Diagnosis not present

## 2020-05-15 DIAGNOSIS — F3289 Other specified depressive episodes: Secondary | ICD-10-CM | POA: Diagnosis not present

## 2020-05-15 DIAGNOSIS — Z20822 Contact with and (suspected) exposure to covid-19: Secondary | ICD-10-CM | POA: Diagnosis not present

## 2020-05-18 ENCOUNTER — Ambulatory Visit
Admission: RE | Admit: 2020-05-18 | Discharge: 2020-05-18 | Disposition: A | Discharge status: Home / Self Care | Payer: BC Managed Care – PPO | Source: Ambulatory Visit | Attending: Nephrology | Admitting: Nephrology

## 2020-05-18 DIAGNOSIS — Z0389 Encounter for observation for other suspected diseases and conditions ruled out: Secondary | ICD-10-CM | POA: Diagnosis not present

## 2020-05-18 DIAGNOSIS — N2889 Other specified disorders of kidney and ureter: Secondary | ICD-10-CM

## 2020-05-21 DIAGNOSIS — F411 Generalized anxiety disorder: Secondary | ICD-10-CM | POA: Diagnosis not present

## 2020-05-21 DIAGNOSIS — F3289 Other specified depressive episodes: Secondary | ICD-10-CM | POA: Diagnosis not present

## 2020-05-28 DIAGNOSIS — F3289 Other specified depressive episodes: Secondary | ICD-10-CM | POA: Diagnosis not present

## 2020-05-28 DIAGNOSIS — F411 Generalized anxiety disorder: Secondary | ICD-10-CM | POA: Diagnosis not present

## 2020-06-04 DIAGNOSIS — F411 Generalized anxiety disorder: Secondary | ICD-10-CM | POA: Diagnosis not present

## 2020-06-04 DIAGNOSIS — F3289 Other specified depressive episodes: Secondary | ICD-10-CM | POA: Diagnosis not present

## 2020-07-13 ENCOUNTER — Encounter (HOSPITAL_COMMUNITY): Payer: Self-pay | Admitting: *Deleted

## 2020-07-13 ENCOUNTER — Emergency Department (HOSPITAL_COMMUNITY)
Admission: EM | Admit: 2020-07-13 | Discharge: 2020-07-14 | Disposition: A | Discharge status: Home / Self Care | Payer: BC Managed Care – PPO | Attending: Emergency Medicine | Admitting: Emergency Medicine

## 2020-07-13 ENCOUNTER — Other Ambulatory Visit: Payer: Self-pay

## 2020-07-13 DIAGNOSIS — Z5321 Procedure and treatment not carried out due to patient leaving prior to being seen by health care provider: Secondary | ICD-10-CM | POA: Insufficient documentation

## 2020-07-13 DIAGNOSIS — R109 Unspecified abdominal pain: Secondary | ICD-10-CM | POA: Insufficient documentation

## 2020-07-13 DIAGNOSIS — K625 Hemorrhage of anus and rectum: Secondary | ICD-10-CM | POA: Insufficient documentation

## 2020-07-13 LAB — CBC
HCT: 42.8 % (ref 36.0–46.0)
Hemoglobin: 14.1 g/dL (ref 12.0–15.0)
MCH: 31.1 pg (ref 26.0–34.0)
MCHC: 32.9 g/dL (ref 30.0–36.0)
MCV: 94.3 fL (ref 80.0–100.0)
Platelets: 271 10*3/uL (ref 150–400)
RBC: 4.54 MIL/uL (ref 3.87–5.11)
RDW: 12.2 % (ref 11.5–15.5)
WBC: 9 10*3/uL (ref 4.0–10.5)
nRBC: 0 % (ref 0.0–0.2)

## 2020-07-13 LAB — URINALYSIS, ROUTINE W REFLEX MICROSCOPIC
Bilirubin Urine: NEGATIVE
Glucose, UA: NEGATIVE mg/dL
Hgb urine dipstick: NEGATIVE
Ketones, ur: NEGATIVE mg/dL
Leukocytes,Ua: NEGATIVE
Nitrite: NEGATIVE
Protein, ur: NEGATIVE mg/dL
Specific Gravity, Urine: 1.004 — ABNORMAL LOW (ref 1.005–1.030)
pH: 6 (ref 5.0–8.0)

## 2020-07-13 LAB — COMPREHENSIVE METABOLIC PANEL
ALT: 16 U/L (ref 0–44)
AST: 22 U/L (ref 15–41)
Albumin: 3.9 g/dL (ref 3.5–5.0)
Alkaline Phosphatase: 57 U/L (ref 38–126)
Anion gap: 12 (ref 5–15)
BUN: 10 mg/dL (ref 6–20)
CO2: 22 mmol/L (ref 22–32)
Calcium: 9.3 mg/dL (ref 8.9–10.3)
Chloride: 104 mmol/L (ref 98–111)
Creatinine, Ser: 0.81 mg/dL (ref 0.44–1.00)
GFR, Estimated: >60 mL/min (ref >60)
Glucose, Bld: 100 mg/dL — ABNORMAL HIGH (ref 70–99)
Potassium: 3.6 mmol/L (ref 3.5–5.1)
Sodium: 138 mmol/L (ref 135–145)
Total Bilirubin: 0.8 mg/dL (ref 0.3–1.2)
Total Protein: 7 g/dL (ref 6.5–8.1)

## 2020-07-13 LAB — I-STAT BETA HCG BLOOD, ED (MC, WL, AP ONLY): I-stat hCG, quantitative: <5 m[IU]/mL (ref <5)

## 2020-07-13 NOTE — ED Triage Notes (Signed)
Pt reports ongoing abd pain, however tonight had one  episode of bright red blood after a bowel movement. Denies urinating at that time. Pt is on birth control continuously and reports spotting for 2 months

## 2020-07-14 NOTE — ED Notes (Signed)
Pt did not want to wait would call in the morning Pt left.

## 2020-07-29 DIAGNOSIS — R1013 Epigastric pain: Secondary | ICD-10-CM | POA: Diagnosis not present

## 2020-07-29 DIAGNOSIS — K219 Gastro-esophageal reflux disease without esophagitis: Secondary | ICD-10-CM | POA: Diagnosis not present

## 2020-07-29 DIAGNOSIS — R109 Unspecified abdominal pain: Secondary | ICD-10-CM | POA: Diagnosis not present

## 2020-07-29 DIAGNOSIS — K589 Irritable bowel syndrome without diarrhea: Secondary | ICD-10-CM | POA: Diagnosis not present

## 2020-07-30 DIAGNOSIS — F411 Generalized anxiety disorder: Secondary | ICD-10-CM | POA: Diagnosis not present

## 2020-07-30 DIAGNOSIS — F3289 Other specified depressive episodes: Secondary | ICD-10-CM | POA: Diagnosis not present

## 2020-08-06 DIAGNOSIS — F3289 Other specified depressive episodes: Secondary | ICD-10-CM | POA: Diagnosis not present

## 2020-08-06 DIAGNOSIS — F411 Generalized anxiety disorder: Secondary | ICD-10-CM | POA: Diagnosis not present

## 2020-08-11 DIAGNOSIS — Z20822 Contact with and (suspected) exposure to covid-19: Secondary | ICD-10-CM | POA: Diagnosis not present

## 2020-09-10 DIAGNOSIS — Z1152 Encounter for screening for COVID-19: Secondary | ICD-10-CM | POA: Diagnosis not present

## 2020-09-14 DIAGNOSIS — Z20822 Contact with and (suspected) exposure to covid-19: Secondary | ICD-10-CM | POA: Diagnosis not present

## 2020-11-05 ENCOUNTER — Emergency Department (HOSPITAL_COMMUNITY): Payer: Self-pay

## 2020-11-05 ENCOUNTER — Other Ambulatory Visit: Payer: Self-pay

## 2020-11-05 ENCOUNTER — Emergency Department (HOSPITAL_COMMUNITY)
Admission: EM | Admit: 2020-11-05 | Discharge: 2020-11-05 | Disposition: A | Discharge status: Home / Self Care | Payer: Self-pay | Attending: Emergency Medicine | Admitting: Emergency Medicine

## 2020-11-05 ENCOUNTER — Encounter (HOSPITAL_COMMUNITY): Payer: Self-pay | Admitting: Pharmacy Technician

## 2020-11-05 DIAGNOSIS — R609 Edema, unspecified: Secondary | ICD-10-CM | POA: Insufficient documentation

## 2020-11-05 DIAGNOSIS — R238 Other skin changes: Secondary | ICD-10-CM | POA: Insufficient documentation

## 2020-11-05 DIAGNOSIS — R0602 Shortness of breath: Secondary | ICD-10-CM | POA: Insufficient documentation

## 2020-11-05 LAB — CBC
HCT: 39.1 % (ref 36.0–46.0)
Hemoglobin: 12.5 g/dL (ref 12.0–15.0)
MCH: 30.4 pg (ref 26.0–34.0)
MCHC: 32 g/dL (ref 30.0–36.0)
MCV: 95.1 fL (ref 80.0–100.0)
Platelets: 260 10*3/uL (ref 150–400)
RBC: 4.11 MIL/uL (ref 3.87–5.11)
RDW: 12.5 % (ref 11.5–15.5)
WBC: 7.6 10*3/uL (ref 4.0–10.5)
nRBC: 0 % (ref 0.0–0.2)

## 2020-11-05 LAB — BASIC METABOLIC PANEL
Anion gap: 8 (ref 5–15)
BUN: 8 mg/dL (ref 6–20)
CO2: 21 mmol/L — ABNORMAL LOW (ref 22–32)
Calcium: 9 mg/dL (ref 8.9–10.3)
Chloride: 109 mmol/L (ref 98–111)
Creatinine, Ser: 0.65 mg/dL (ref 0.44–1.00)
GFR, Estimated: >60 mL/min (ref >60)
Glucose, Bld: 100 mg/dL — ABNORMAL HIGH (ref 70–99)
Potassium: 3.9 mmol/L (ref 3.5–5.1)
Sodium: 138 mmol/L (ref 135–145)

## 2020-11-05 LAB — TROPONIN I (HIGH SENSITIVITY)
Troponin I (High Sensitivity): <2 ng/L (ref <18)
Troponin I (High Sensitivity): <2 ng/L (ref <18)

## 2020-11-05 LAB — I-STAT BETA HCG BLOOD, ED (MC, WL, AP ONLY): I-stat hCG, quantitative: <5 m[IU]/mL (ref <5)

## 2020-11-05 MED ORDER — IOHEXOL 350 MG/ML SOLN
55.0000 mL | Freq: Once | INTRAVENOUS | Status: AC | PRN
  Administered 2020-11-05: 55 mL via INTRAVENOUS

## 2020-11-05 NOTE — ED Notes (Signed)
Reviewed discharge instructions with patient. Follow-up care reviewed. Patient verbalized understanding. Patient A&Ox4, VSS, and ambulatory with steady gait upon discharge.  

## 2020-11-05 NOTE — ED Triage Notes (Signed)
Pt here with reports of hands turning blue for approx 10 minutes intermittently. Pt also endorses shob. Dx with covid back in January. Pt spoke with PCP and was sent here for PE rule out and cardiac workup.

## 2020-11-05 NOTE — ED Provider Notes (Addendum)
[facility] Provider Note   CSN: [id number] Arrival date & time: 11/05/20  1129     History Chief Complaint  Patient presents with  . Shortness of Breath  . Circulatory Problem         Savannah Beck is a 30 y.o. female w PMHx venous insufficiency, presenting to the ED with complaint of shortness of breath and skin color change. Patient states over the last few days she has been having intermittent episodes of her hands turning white then blue. She states yesterday evening she had an episode lasting about 7 minutes in duration where her "whole body was blue." She states symptoms began to improve. She got into a warm bath and also used her compression device on her legs for an hour. Her hands were not painful with the color change. No hx of raynauds. No drug use.  Regarding her shortness of breath, she has been having some SOB since her COVID-19 illness in January, however this has been worsening over the last 48 hours. SOB is worse with exertion and laying flat. She has some squeezing sensation in her chest.  She stopped OCPs in November, no hx DVT/PE, no recent immobilization or trauma. States her identical twin sister is currently being diagnosed with some connective tissue disorder, no hx of the same.  States she has hx venous insufficiency in her legs, worse on the left.   The history is provided by the patient.       Past Medical History:  Diagnosis Date  . Bilateral ovarian cysts   . Hyperlipidemia     Patient Active Problem List   Diagnosis Date Noted  . 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 ANKLE PAIN, RIGHT 07/01/2010  . OTHER ENTHESOPATHY OF ANKLE AND TARSUS 07/01/2010    Past Surgical History:  Procedure Laterality Date  . ANGIOMA CAUTERY Right      OB History   No obstetric history on file.     Family History  Problem Relation Age of Onset  . Thyroid disease Mother   . Diabetes Mother   . Cancer  Father   . Diabetes Other   . CAD Other   . Alzheimer's disease Other   . Asthma Other   . Hypertension Other   . Breast cancer Paternal Grandmother     Social History   Tobacco Use  . Smoking status: Never Smoker  . Smokeless tobacco: Never Used  Substance Use Topics  . Alcohol use: Yes    Alcohol/week: 0.0 standard drinks    Comment: once a month  . Drug use: No    Home Medications Prior to Admission medications   Medication Sig Start Date End Date Taking? Authorizing Provider  cetirizine (ZYRTEC) 10 MG tablet Take 10 mg by mouth daily.    [provider]  dicyclomine (BENTYL) 20 MG tablet Take 1 tablet (20 mg total) by mouth 2 (two) times daily. 03/09/20   Fondaw, Kathleene Hazel, PA  fluticasone (FLONASE) 50 MCG/ACT nasal spray Place 1-2 sprays into both nostrils daily as needed for allergies or rhinitis.     [provider]  hydrOXYzine (ATARAX/VISTARIL) 50 MG tablet Take 50 mg by mouth daily as needed for anxiety.    [provider]  ibuprofen (ADVIL) 200 MG tablet Take 200-600 mg by mouth every 6 (six) hours as needed for mild pain.    [provider]  ibuprofen (ADVIL,MOTRIN) 800 MG tablet Take 1 tablet (800 mg total) by mouth  every 8 (eight) hours as needed. Patient not taking: Reported on 07/24/2019 09/20/16   Marcille Buffy D, CNM  lidocaine (XYLOCAINE) 2 % solution Use as directed 15 mLs in the mouth or throat as needed for mouth pain. 03/09/20   Tedd Sias, PA  Norgestimate-Ethinyl Estradiol Triphasic (TRI-ESTARYLLA) 0.18/0.215/0.25 MG-35 MCG tablet Take 1 tablet by mouth daily.    [provider]  omeprazole (PRILOSEC) 20 MG capsule Take 1 capsule (20 mg total) by mouth daily for 14 days. 03/09/20 03/23/20  Tedd Sias, PA  ondansetron (ZOFRAN ODT) 4 MG disintegrating tablet Take 1 tablet (4 mg total) by mouth every 8 (eight) hours as needed for nausea or vomiting. 07/24/19   Tacy Learn, PA-C  ondansetron (ZOFRAN) 4 MG  tablet Take 1 tablet (4 mg total) by mouth every 6 (six) hours. Patient not taking: Reported on 07/24/2019 10/12/14   Ezequiel Essex, MD    Allergies    Other, Amoxicillin, and Penicillins  Review of Systems   Review of Systems  Constitutional: Negative for fever.  Respiratory: Positive for chest tightness and shortness of breath.   Skin: Positive for color change.  All other systems reviewed and are negative.   Physical Exam Updated Vital Signs BP (!) 119/98 (BP Location: Left Arm)   Pulse 82   Temp 98 F (36.7 C) (Oral)   Resp 18   Ht 4\' 9"  (1.448 m)   Wt 77.1 kg   SpO2 100%   BMI 36.79 kg/m   Physical Exam Vitals and nursing note reviewed.  Constitutional:      Appearance: She is well-developed.  HENT:     Head: Normocephalic and atraumatic.  Eyes:     Conjunctiva/sclera: Conjunctivae normal.  Cardiovascular:     Rate and Rhythm: Normal rate and regular rhythm.  Pulmonary:     Effort: Pulmonary effort is normal. No respiratory distress.     Breath sounds: Normal breath sounds.     Comments: Speaking in full sentences, normal work of breathing Abdominal:     Palpations: Abdomen is soft.  Musculoskeletal:     Comments: Trace BLE edema  Skin:    General: Skin is warm.     Comments: Distal extremities are warm and well-perfused with brisk cap refill  Neurological:     Mental Status: She is alert.  Psychiatric:        Behavior: Behavior normal.     ED Results / Procedures / Treatments   Labs (all labs ordered are listed, but only abnormal results are displayed) Labs Reviewed  BASIC METABOLIC PANEL - Abnormal; Notable for the following components:      Result Value   CO2 21 (*)    Glucose, Bld 100 (*)    All other components within normal limits  CBC  I-STAT BETA HCG BLOOD, ED ([facility], [facility], [facility] ONLY)  TROPONIN I (HIGH SENSITIVITY)  TROPONIN I (HIGH SENSITIVITY)    EKG EKG Interpretation  Date/Time:  Thursday November 05 2020 12:21:54 EST Ventricular  Rate:  92 PR Interval:  130 QRS Duration: 78 QT Interval:  368 QTC Calculation: 455 R Axis:   51 Text Interpretation: Normal sinus rhythm Normal ECG Confirmed by Lacretia Leigh ([id number]) on 11/05/2020 3:50:40 PM   Radiology DG Chest 2 View  Result Date: 11/05/2020 CLINICAL DATA:  Shortness of breath. The patient's hands reportedly turned blue for 10 minutes today. EXAM: CHEST - 2 VIEW COMPARISON:  PA and lateral chest 03/09/2020. FINDINGS: The lungs are clear. Heart  size is normal. No pneumothorax or pleural fluid. No bony abnormality. IMPRESSION: Normal chest. Electronically Signed   By: Inge Rise M.D.   On: 11/05/2020 13:19   CT Angio Chest PE W/Cm &/Or Wo Cm  Result Date: 11/05/2020 CLINICAL DATA:  Shortness of breath. Hands turning blue. Prior COVID. EXAM: CT ANGIOGRAPHY CHEST WITH CONTRAST TECHNIQUE: Multidetector CT imaging of the chest was performed using the standard protocol during bolus administration of intravenous contrast. Multiplanar CT image reconstructions and MIPs were obtained to evaluate the vascular anatomy. CONTRAST:  34mL OMNIPAQUE IOHEXOL 350 MG/ML SOLN COMPARISON:  None. FINDINGS: Cardiovascular: Contrast injection is sufficient to demonstrate satisfactory opacification of the pulmonary arteries to the segmental level. There is no pulmonary embolus or evidence of right heart strain. The size of the main pulmonary artery is normal. Heart size is normal, with no pericardial effusion. The course and caliber of the aorta are normal. There is no atherosclerotic calcification. Opacification decreased due to pulmonary arterial phase contrast bolus timing. Mediastinum/Nodes: -- No mediastinal lymphadenopathy. -- No hilar lymphadenopathy. -- No axillary lymphadenopathy. -- No supraclavicular lymphadenopathy. -- Normal thyroid gland where visualized. -  Unremarkable esophagus. Lungs/Pleura: Airways are patent. No pleural effusion, lobar consolidation, pneumothorax or pulmonary  infarction. Upper Abdomen: Contrast bolus timing is not optimized for evaluation of the abdominal organs. The visualized portions of the organs of the upper abdomen are normal. Musculoskeletal: No chest wall abnormality. No bony spinal canal stenosis. There is a rounded mass in the patient's right breast that was previously evaluated by ultrasound. This mass measures approximately 1.9 cm (axial series 5, image 25). Review of the MIP images confirms the above findings. IMPRESSION: No evidence of pulmonary embolism or other acute intrathoracic process. Electronically Signed   By: Constance Holster M.D.   On: 11/05/2020 19:16    Procedures Procedures   Medications Ordered in ED Medications  iohexol (OMNIPAQUE) 350 MG/ML injection 55 mL (55 mLs Intravenous Contrast Given 11/05/20 1819)    ED Course  I have reviewed the triage vital signs and the nursing notes.  Pertinent labs & imaging results that were available during my care of the patient were reviewed by me and considered in my medical decision making (see chart for details).    MDM Rules/Calculators/A&P                          Patient presenting to the emergency department with complaint of intermittent color change of her hands as well as shortness of breath over the last couple of days.  Endorses shortness of breath since COVID-19 illness in January, however thinks symptoms are worsening the last few days.  Her hands have also intermittently been turning blue color without associated pain.  This color changes are brief in duration and resolved without intervention.  She denies drug use or other regular oral medications.  Not on OCPs.   Considering patient's symptomatology, PE study was done and is negative.  Her cardiac enzyme obtained in triage is undetectable, remainder of blood work is unremarkable.  Her chest x-ray is negative.  She is satting 100% on room air, normal work of breathing, no distress is noted.  No evidence of emergent or  life-threatening pathology through work-up today.  Presentation does sound suspicious for Raynaud's versus other vasospasm.  She is instructed to follow closely with PCP for further nonemergent work-up.  Strict return precautions discussed.  Presentation and work-up discussed with attending physician Dr. Zenia Resides, who is  in agreement with care plan for discharge.  Final Clinical Impression(s) / ED Diagnoses Final diagnoses:  Shortness of breath  Change of skin color    Rx / DC Orders ED Discharge Orders    None       Robinson, [place] N, PA-C 11/05/20 2114    Robinson, [place] N, PA-C 11/05/20 2115    Lacretia Leigh, MD 11/09/20 1406

## 2020-11-05 NOTE — Discharge Instructions (Addendum)
Your CT scan, chest x-ray, EKG and blood work are very reassuring today.   Please follow closely with your primary care provider regarding your visit today. Return to the emergency department if you develop severely worsening symptoms.

## 2020-11-18 ENCOUNTER — Telehealth: Payer: Self-pay

## 2020-11-18 NOTE — Telephone Encounter (Signed)
NOTES ON FILE FROM [facility] [phone], SENT REFERRAL TO SCHEDULING 

## 2020-12-03 ENCOUNTER — Ambulatory Visit (INDEPENDENT_AMBULATORY_CARE_PROVIDER_SITE_OTHER): Payer: BC Managed Care – PPO

## 2020-12-03 ENCOUNTER — Encounter: Payer: Self-pay | Admitting: Internal Medicine

## 2020-12-03 ENCOUNTER — Ambulatory Visit (INDEPENDENT_AMBULATORY_CARE_PROVIDER_SITE_OTHER): Payer: BC Managed Care – PPO | Admitting: Internal Medicine

## 2020-12-03 ENCOUNTER — Other Ambulatory Visit: Payer: Self-pay

## 2020-12-03 VITALS — BP 120/74 | HR 78 | Ht <= 58 in | Wt 175.0 lb

## 2020-12-03 DIAGNOSIS — R0602 Shortness of breath: Secondary | ICD-10-CM

## 2020-12-03 DIAGNOSIS — R002 Palpitations: Secondary | ICD-10-CM | POA: Diagnosis not present

## 2020-12-03 DIAGNOSIS — E782 Mixed hyperlipidemia: Secondary | ICD-10-CM | POA: Diagnosis not present

## 2020-12-03 NOTE — Patient Instructions (Signed)
Medication Instructions:  Your physician recommends that you continue on your current medications as directed. Please refer to the Current Medication list given to you today.  *If you need a refill on your cardiac medications before your next appointment, please call your pharmacy*   Lab Work: NONE If you have labs (blood work) drawn today and your tests are completely normal, you will receive your results only by: Marland Kitchen MyChart Message (if you have MyChart) OR . A paper copy in the mail If you have any lab test that is abnormal or we need to change your treatment, we will call you to review the results.   Testing/Procedures: Your physician has requested that you have an echocardiogram. Echocardiography is a painless test that uses sound waves to create images of your heart. It provides your doctor with information about the size and shape of your heart and how well your heart's chambers and valves are working. This procedure takes approximately one hour. There are no restrictions for this procedure.  Your physician has requested that you wear a heart monitor.    Follow-Up: At [facility], you and your health needs are our priority.  As part of our continuing mission to provide you with exceptional heart care, we have created designated Provider Care Teams.  These Care Teams include your primary Cardiologist (physician) and Advanced Practice Providers (APPs -  Physician Assistants and Nurse Practitioners) who all work together to provide you with the care you need, when you need it.   Your next appointment:   5-6 month(s)  The format for your next appointment:   In Person  Provider:   You may see Werner Lean, MD or one of the following Advanced Practice Providers on your designated Care Team:    Melina Copa, PA-C  Ermalinda Barrios, PA-C    Other Instructions  Bryn Gulling- Long Term Monitor Instructions   Your physician has requested you wear your ZIO patch  monitor___14___days.   This is a single patch monitor.  [employer] supplies one patch monitor per enrollment.  Additional stickers are not available.   Please do not apply patch if you will be having a Nuclear Stress Test, Echocardiogram, Cardiac CT, MRI, or Chest Xray during the time frame you would be wearing the monitor. The patch cannot be worn during these tests.  You cannot remove and re-apply the ZIO XT patch monitor.   Your ZIO patch monitor will be sent USPS Priority mail from [facility] directly to your home address. The monitor may also be mailed to a PO BOX if home delivery is not available.   It may take 3-5 days to receive your monitor after you have been enrolled.   Once you have received you monitor, please review enclosed instructions.  Your monitor has already been registered assigning a specific monitor serial # to you.   Applying the monitor   Shave hair from upper left chest.   Hold abrader disc by orange tab.  Rub abrader in 40 strokes over left upper chest as indicated in your monitor instructions.   Clean area with 4 enclosed alcohol pads .  Use all pads to assure are is cleaned thoroughly.  Let dry.   Apply patch as indicated in monitor instructions.  Patch will be place under collarbone on left side of chest with arrow pointing upward.   Rub patch adhesive wings for 2 minutes.Remove white label marked "1".  Remove white label marked "2".  Rub patch adhesive wings for 2  additional minutes.   While looking in a mirror, press and release button in center of patch.  A small green light will flash 3-4 times .  This will be your only indicator the monitor has been turned on.     Do not shower for the first 24 hours.  You may shower after the first 24 hours.   Press button if you feel a symptom. You will hear a small click.  Record Date, Time and Symptom in the Patient Log Book.   When you are ready to remove patch, follow instructions on last 2 pages of Patient  Log Book.  Stick patch monitor onto last page of Patient Log Book.   Place Patient Log Book in [place] box.  Use locking tab on box and tape box closed securely.  The [employer] has [employer] on it.  Please place in mailbox as soon as possible.  Your physician should have your test results approximately 7 days after the monitor has been mailed back to [facility].   Call [place] at [phone] if you have questions regarding your ZIO XT patch monitor.  Call them immediately if you see an orange light blinking on your monitor.   If your monitor falls off in less than 4 days contact our Monitor department at [phone].  If your monitor becomes loose or falls off after 4 days call [employer] at [phone] for suggestions on securing your monitor.

## 2020-12-03 NOTE — Progress Notes (Signed)
Cardiology Office Note:    Date:  12/03/2020   ID:  Savannah Beck, DOB April 07, 1991, MRN [id number]  PCP:  London Pepper, [place]  Cardiologist:  Werner Lean, MD  Advanced Practice Provider:  No care team member to display Electrophysiologist:  None       CC: SOB Consulted for the evaluation of SOB at the behest of London Pepper, MD  History of Present Illness:    Savannah Beck is a 30 y.o. female with a hx of Morbid Obesity HLD  family history of connective tissue disorder NOS; BPPV, Covid-19 history from January 2022 who presents for evaluation 12/03/20.  Patient notes that she is feeling terrible.  Had 11/04/20- has profound discoloration with SOB and CP. Had ED visit 11/05/20 with benign work up- thought to be ONEOK.  Since this has felt extremely tired with SOB and palpitations.  Has had heart rates as fast as 140s at rest.  With minimal activity has had high heart rates.  Has had chest pain around two months during her January and February cycles.  Had night sweats and SOB for two months.  Lost sense of smell but it is returning.  Discomfort occurs as spontaneous palpitations, worsens spontaneously but also worse with activity, and improves with rest.  Patient exertion notable for walking up the steps for three flights of stairs and feels terrible and wheezy. Gets waves of dizziness with out funny heart beats and and different from her vertigo.  Patient reports NO prior cardiac testing including  echo,  stress test,  heart catheterizations,  cardioversion,  ablations.   History of prematurity (2 months).  Past Medical History:  Diagnosis Date  . Bilateral ovarian cysts   . Hyperlipidemia     Past Surgical History:  Procedure Laterality Date  . ANGIOMA CAUTERY Right     Current Medications: No outpatient medications have been marked as taking for the 12/03/20 encounter (Office Visit) with Werner Lean, MD.      Allergies:   Dicyclomine hcl, Hyoscyamine, Other, Pine, Amoxicillin, and Penicillins   Social History   Socioeconomic History  . Marital status: Married    Spouse name: Not on file  . Number of children: Not on file  . Years of education: Not on file  . Highest education level: Not on file  Occupational History  . Not on file  Tobacco Use  . Smoking status: Never Smoker  . Smokeless tobacco: Never Used  Substance and Sexual Activity  . Alcohol use: Yes    Alcohol/week: 0.0 standard drinks    Comment: once a month  . Drug use: No  . Sexual activity: Not on file  Other Topics Concern  . Not on file  Social History Narrative  . Not on file   Social Determinants of Health   Financial Resource Strain: Not on file  Food Insecurity: Not on file  Transportation Needs: Not on file  Physical Activity: Not on file  Stress: Not on file  Social Connections: Not on file     Family History: The patient's family history includes Alzheimer's disease in an other family member; Asthma in an other family member; Breast cancer in her paternal grandmother; CAD in an other family member; Cancer in her father; Diabetes in her mother and another family member; Hypertension in an other family member; Thyroid disease in her mother.  Sister- Bipolar Disorder with migraine with stroke. History of coronary artery disease notable for no  members. History of heart failure notable for no members. History of arrhythmia notable for no members. Denies family history of sudden cardiac death including drowning, car accidents, or unexplained deaths in the family. No history of bicuspid aortic valve or aortic aneurysm or dissection.   No history of cardiomyopathies including hypertrophic cardiomyopathy, left ventricular non-compaction, or arrhythmogenic right ventricular cardiomyopathy.   ROS:   Please see the history of present illness.     All other systems reviewed and are negative.  EKGs/Labs/Other  Studies Reviewed:    The following studies were reviewed today:  EKG:  EKG is  ordered today.  The ekg ordered today demonstrates  12/03/20: SR 78 WNL  Recent Labs: 07/13/2020: ALT 16 11/05/2020: BUN 8; Creatinine, Ser 0.65; Hemoglobin 12.5; Platelets 260; Potassium 3.9; Sodium 138  Recent Lipid Panel No results found for: CHOL, TRIG, HDL, CHOLHDL, VLDL, LDLCALC, LDLDIRECT   Risk Assessment/Calculations:     N/A  Physical Exam:    VS:  BP 120/74   Pulse 78   Ht 4\' 9"  (1.448 m)   Wt 175 lb (79.4 kg)   SpO2 98%   BMI 37.87 kg/m     Wt Readings from Last 3 Encounters:  12/03/20 175 lb (79.4 kg)  11/05/20 170 lb (77.1 kg)  03/09/20 170 lb (77.1 kg)    [place] Score: 0-1  GEN: Obese, well developed in no acute distress HEENT: Normal NECK: No JVD; No carotid bruits LYMPHATICS: No lymphadenopathy CARDIAC: RRR, no murmurs, rubs, gallops RESPIRATORY:  Clear to auscultation without rales, wheezing or rhonchi  ABDOMEN: Soft, non-tender, non-distended MUSCULOSKELETAL:  No edema; No deformity  SKIN: Warm and dry NEUROLOGIC:  Alert and oriented x 3 PSYCHIATRIC:  Normal affect   ASSESSMENT:    1. SOB (shortness of breath)   2. Palpitations   3. Morbid obesity ([place])   4. Mixed hyperlipidemia    PLAN:    In order of problems listed above:  DOE Palpitations Morbid Obesity with HLD Covid-19 Long Hauler - will get echo and 14 day non live ZioPatch - no new medications  Summer/fall follow up unless new symptoms or abnormal test results warranting change in plan Would be reasonable for  APP Follow up     Medication Adjustments/Labs and Tests Ordered: Current medicines are reviewed at length with the patient today.  Concerns regarding medicines are outlined above.  Orders Placed This Encounter  Procedures  . LONG TERM MONITOR (3-14 DAYS)  . EKG 12-Lead  . ECHOCARDIOGRAM COMPLETE   No orders of the defined types were placed in this encounter.   Patient Instructions   Medication Instructions:  Your physician recommends that you continue on your current medications as directed. Please refer to the Current Medication list given to you today.  *If you need a refill on your cardiac medications before your next appointment, please call your pharmacy*   Lab Work: NONE If you have labs (blood work) drawn today and your tests are completely normal, you will receive your results only by: Marland Kitchen MyChart Message (if you have MyChart) OR . A paper copy in the mail If you have any lab test that is abnormal or we need to change your treatment, we will call you to review the results.   Testing/Procedures: Your physician has requested that you have an echocardiogram. Echocardiography is a painless test that uses sound waves to create images of your heart. It provides your doctor with information about the size and shape of your heart and how  well your heart's chambers and valves are working. This procedure takes approximately one hour. There are no restrictions for this procedure.  Your physician has requested that you wear a heart monitor.    Follow-Up: At [facility], you and your health needs are our priority.  As part of our continuing mission to provide you with exceptional heart care, we have created designated Provider Care Teams.  These Care Teams include your primary Cardiologist (physician) and Advanced Practice Providers (APPs -  Physician Assistants and Nurse Practitioners) who all work together to provide you with the care you need, when you need it.   Your next appointment:   5-6 month(s)  The format for your next appointment:   In Person  Provider:   You may see Werner Lean, MD or one of the following Advanced Practice Providers on your designated Care Team:    Melina Copa, PA-C  Ermalinda Barrios, PA-C    Other Instructions  Bryn Gulling- Long Term Monitor Instructions   Your physician has requested you wear your ZIO patch  monitor___14___days.   This is a single patch monitor.  [employer] supplies one patch monitor per enrollment.  Additional stickers are not available.   Please do not apply patch if you will be having a Nuclear Stress Test, Echocardiogram, Cardiac CT, MRI, or Chest Xray during the time frame you would be wearing the monitor. The patch cannot be worn during these tests.  You cannot remove and re-apply the ZIO XT patch monitor.   Your ZIO patch monitor will be sent USPS Priority mail from [facility] directly to your home address. The monitor may also be mailed to a PO BOX if home delivery is not available.   It may take 3-5 days to receive your monitor after you have been enrolled.   Once you have received you monitor, please review enclosed instructions.  Your monitor has already been registered assigning a specific monitor serial # to you.   Applying the monitor   Shave hair from upper left chest.   Hold abrader disc by orange tab.  Rub abrader in 40 strokes over left upper chest as indicated in your monitor instructions.   Clean area with 4 enclosed alcohol pads .  Use all pads to assure are is cleaned thoroughly.  Let dry.   Apply patch as indicated in monitor instructions.  Patch will be place under collarbone on left side of chest with arrow pointing upward.   Rub patch adhesive wings for 2 minutes.Remove white label marked "1".  Remove white label marked "2".  Rub patch adhesive wings for 2 additional minutes.   While looking in a mirror, press and release button in center of patch.  A small green light will flash 3-4 times .  This will be your only indicator the monitor has been turned on.     Do not shower for the first 24 hours.  You may shower after the first 24 hours.   Press button if you feel a symptom. You will hear a small click.  Record Date, Time and Symptom in the Patient Log Book.   When you are ready to remove patch, follow instructions on last 2 pages of Patient  Log Book.  Stick patch monitor onto last page of Patient Log Book.   Place Patient Log Book in Welcome box.  Use locking tab on box and tape box closed securely.  The [employer] and [employer] has [employer] on it.  Please place  in mailbox as soon as possible.  Your physician should have your test results approximately 7 days after the monitor has been mailed back to [facility].   Call [place] at [phone] if you have questions regarding your ZIO XT patch monitor.  Call them immediately if you see an orange light blinking on your monitor.   If your monitor falls off in less than 4 days contact our Monitor department at [phone].  If your monitor becomes loose or falls off after 4 days call [employer] at [phone] for suggestions on securing your monitor.      Signed, Werner Lean, MD  12/03/2020 5:12 PM    [facility]

## 2020-12-08 DIAGNOSIS — R002 Palpitations: Secondary | ICD-10-CM | POA: Diagnosis not present

## 2020-12-08 DIAGNOSIS — R0602 Shortness of breath: Secondary | ICD-10-CM

## 2021-01-05 ENCOUNTER — Ambulatory Visit (HOSPITAL_COMMUNITY): Payer: BC Managed Care – PPO | Attending: Cardiology

## 2021-01-05 ENCOUNTER — Other Ambulatory Visit: Payer: Self-pay

## 2021-01-05 DIAGNOSIS — R002 Palpitations: Secondary | ICD-10-CM | POA: Diagnosis not present

## 2021-01-05 DIAGNOSIS — R0602 Shortness of breath: Secondary | ICD-10-CM | POA: Insufficient documentation

## 2021-01-05 LAB — ECHOCARDIOGRAM COMPLETE
Area-P 1/2: 4.15 cm2
S' Lateral: 2.9 cm

## 2021-01-06 ENCOUNTER — Telehealth: Payer: Self-pay | Admitting: *Deleted

## 2021-01-06 NOTE — Telephone Encounter (Signed)
-----   Message from Werner Lean, MD sent at 01/06/2021  1:14 PM EDT ----- Results: Normal study Plan: Offer post COVID-19 clinic referral  Werner Lean, MD

## 2021-01-06 NOTE — Telephone Encounter (Signed)
Called the patient and gave her the phone number for the post covid 19 clinic to make an appointment. She is agreeable and verbalized understanding

## 2021-01-13 DIAGNOSIS — D1801 Hemangioma of skin and subcutaneous tissue: Secondary | ICD-10-CM | POA: Diagnosis not present

## 2021-01-13 DIAGNOSIS — D2261 Melanocytic nevi of right upper limb, including shoulder: Secondary | ICD-10-CM | POA: Diagnosis not present

## 2021-01-13 DIAGNOSIS — L858 Other specified epidermal thickening: Secondary | ICD-10-CM | POA: Diagnosis not present

## 2021-01-13 DIAGNOSIS — D2262 Melanocytic nevi of left upper limb, including shoulder: Secondary | ICD-10-CM | POA: Diagnosis not present

## 2021-01-14 ENCOUNTER — Ambulatory Visit (INDEPENDENT_AMBULATORY_CARE_PROVIDER_SITE_OTHER): Payer: BC Managed Care – PPO | Admitting: Nurse Practitioner

## 2021-01-14 VITALS — BP 119/74 | HR 82 | Temp 97.9°F | Resp 18

## 2021-01-14 DIAGNOSIS — I499 Cardiac arrhythmia, unspecified: Secondary | ICD-10-CM

## 2021-01-14 DIAGNOSIS — R413 Other amnesia: Secondary | ICD-10-CM | POA: Diagnosis not present

## 2021-01-14 DIAGNOSIS — R0683 Snoring: Secondary | ICD-10-CM

## 2021-01-14 DIAGNOSIS — R0602 Shortness of breath: Secondary | ICD-10-CM | POA: Diagnosis not present

## 2021-01-14 DIAGNOSIS — Z8616 Personal history of COVID-19: Secondary | ICD-10-CM | POA: Diagnosis not present

## 2021-01-14 DIAGNOSIS — R5381 Other malaise: Secondary | ICD-10-CM

## 2021-01-14 NOTE — Patient Instructions (Addendum)
History of Covid 19 Sleep disturbance Fatigue Deconditioning Memory loss Snoring Shortness of breath:   Stay well hydrated  Stay active  Deep breathing exercises  May start vitamin C daily, vitamin D3 daily, Zinc daily  May take tylenol for fever or pain  Will place referral to PT  Will place referral to speech for cognitive rehabilitation  Will place referral to pulmonary - may need PFT and sleep eval  Will place referral to EP - second opinion  Will check labs  Please try melatonin 1 mg with magnesium glycinate 400 mg 1 hour before bedtime    Follow up:  Follow up in 3 months or sooner if needed

## 2021-01-14 NOTE — Progress Notes (Signed)
@Patient  ID: Henri Medal, female    DOB: 05-21-1991, 30 y.o.   MRN: [id number]  Chief Complaint  Patient presents with  . history of covid    Referring provider: London Pepper, MD  HPI  Patient presents today for post-COVID care clinic visit.  Patient states that she tested positive for COVID on 09/14/2020.  She has had full cardiac work-up through cardiology and everything came back normal.  She states that she continues to have issues with sleep, fatigue, shortness of breath at times with exertion, memory loss.  Patient states that she did have a CTA that was clear. Denies f/c/s, n/v/d, hemoptysis, PND, chest pain or edema.     Allergies  Allergen Reactions  . Dicyclomine Hcl Other (See Comments)  . Hyoscyamine Other (See Comments)  . Other Itching    Cilantro-   . Pine Other (See Comments)  . Amoxicillin Hives and Rash    Reaction occurred to patient's identical twin sister: Rash up and down both legs instantly (from Amoxicillin)  . Penicillins Hives and Rash    Reaction (to Amoxicillin) occurred to patient's identical twin sister: Did it involve swelling of the face/tongue/throat, SOB, or low BP? No Did it involve sudden or severe rash/hives, skin peeling, or any reaction on the inside of your mouth or nose? Unk Did you need to seek medical attention at a hospital or doctor's office? Yes When did it last happen? "years ago, to identical sister" If all above answers are "NO", may proceed with cephalosporin use.      There is no immunization history on file for this patient.  Past Medical History:  Diagnosis Date  . Bilateral ovarian cysts   . Hyperlipidemia     Tobacco History: Social History   Tobacco Use  Smoking Status Never Smoker  Smokeless Tobacco Never Used   Counseling given: Yes   Outpatient Encounter Medications as of 01/14/2021  Medication Sig  . cetirizine (ZYRTEC) 10 MG tablet Take 10 mg by mouth daily.  . fluticasone (FLONASE) 50 MCG/ACT nasal  spray Place 1-2 sprays into both nostrils daily as needed for allergies or rhinitis.   . hydrOXYzine (ATARAX/VISTARIL) 50 MG tablet Take 50 mg by mouth daily as needed for anxiety.  . lidocaine (XYLOCAINE) 2 % solution Use as directed 15 mLs in the mouth or throat as needed for mouth pain.  . Norgestimate-Ethinyl Estradiol Triphasic (TRI-ESTARYLLA) 0.18/0.215/0.25 MG-35 MCG tablet Take 1 tablet by mouth daily.  . polyethylene glycol powder (GLYCOLAX/MIRALAX) 17 GM/SCOOP powder as needed.   No facility-administered encounter medications on file as of 01/14/2021.     Review of Systems  Review of Systems  Constitutional: Positive for fatigue. Negative for fever.  HENT: Negative.   Respiratory: Positive for shortness of breath. Negative for cough.   Cardiovascular: Negative.  Negative for chest pain, palpitations and leg swelling.  Gastrointestinal: Negative.   Allergic/Immunologic: Negative.   Neurological: Negative.   Psychiatric/Behavioral: Positive for decreased concentration and sleep disturbance.       Physical Exam  BP 119/74   Pulse 82   Temp 97.9 F (36.6 C)   Resp 18   SpO2 99%   Wt Readings from Last 5 Encounters:  12/03/20 175 lb (79.4 kg)  11/05/20 170 lb (77.1 kg)  03/09/20 170 lb (77.1 kg)  03/22/19 165 lb (74.8 kg)  01/16/18 165 lb (74.8 kg)     Physical Exam Vitals and nursing note reviewed.  Constitutional:      General: She is not  in acute distress.    Appearance: She is well-developed.  Cardiovascular:     Rate and Rhythm: Normal rate and regular rhythm.  Pulmonary:     Effort: Pulmonary effort is normal.     Breath sounds: Normal breath sounds.  Musculoskeletal:     Right lower leg: No edema.     Left lower leg: No edema.  Neurological:     Mental Status: She is alert and oriented to person, place, and time.  Psychiatric:        Mood and Affect: Mood normal.        Behavior: Behavior normal.      Lab Results:  CBC    Component Value  Date/Time   WBC 7.7 01/14/2021 1436   WBC 7.6 11/05/2020 1221   RBC 4.75 01/14/2021 1436   RBC 4.11 11/05/2020 1221   HGB 13.6 01/14/2021 1436   HCT 41.9 01/14/2021 1436   PLT 261 01/14/2021 1436   MCV 88 01/14/2021 1436   MCH 28.6 01/14/2021 1436   MCH 30.4 11/05/2020 1221   MCHC 32.5 01/14/2021 1436   MCHC 32.0 11/05/2020 1221   RDW 13.0 01/14/2021 1436   LYMPHSABS 1.7 07/24/2019 0459   MONOABS 0.6 07/24/2019 0459   EOSABS 0.1 07/24/2019 0459   BASOSABS 0.0 07/24/2019 0459    BMET    Component Value Date/Time   NA 137 01/14/2021 1436   K 4.3 01/14/2021 1436   CL 98 01/14/2021 1436   CO2 19 (L) 01/14/2021 1436   GLUCOSE 87 01/14/2021 1436   GLUCOSE 100 (H) 11/05/2020 1233   BUN 7 01/14/2021 1436   CREATININE 0.78 01/14/2021 1436   CALCIUM 9.9 01/14/2021 1436   GFRNONAA >60 11/05/2020 1233   GFRAA >60 03/09/2020 0603     Imaging: ECHOCARDIOGRAM COMPLETE  Result Date: 01/05/2021    ECHOCARDIOGRAM REPORT   Patient Name:   LILYANNAH ZUELKE Date of Exam: 01/05/2021 Medical Rec #:  [id number]     Height:       57.0 in Accession #:    [id number]    Weight:       175.0 lb Date of Birth:  [birth year]    BSA:          1.699 m Patient Age:    29 years      BP:           120/74 mmHg Patient Gender: F             HR:           70 bpm. Exam Location:  [place] Procedure: 3D Echo, 2D Echo, Cardiac Doppler, Color Doppler and Strain Analysis Indications:    R00.2 Palpitations  History:        Patient has no prior history of Echocardiogram examinations.                 Risk Factors:Dyslipidemia.  Sonographer:    Cresenciano Lick RDCS Referring Phys: [id number] [facility] A CHANDRASEKHAR IMPRESSIONS  1. Left ventricular ejection fraction, by estimation, is 65 to 70%. Left ventricular ejection fraction by 3D volume is 66 %. The left ventricle has normal function. The left ventricle has no regional wall motion abnormalities. Left ventricular diastolic  parameters were normal. The average left  ventricular global longitudinal strain is -21.9 %.  2. Right ventricular systolic function is normal. The right ventricular size is normal. Tricuspid regurgitation signal is inadequate for assessing PA pressure.  3. The mitral valve is normal in structure. Trivial  mitral valve regurgitation.  4. The aortic valve is tricuspid. Aortic valve regurgitation is not visualized. No aortic stenosis is present.  5. The inferior vena cava is normal in size with greater than 50% respiratory variability, suggesting right atrial pressure of 3 mmHg. FINDINGS  Left Ventricle: Left ventricular ejection fraction, by estimation, is 65 to 70%. Left ventricular ejection fraction by 3D volume is 66 %. The left ventricle has normal function. The left ventricle has no regional wall motion abnormalities. The average left ventricular global longitudinal strain is -21.9 %. The left ventricular internal cavity size was normal in size. There is no left ventricular hypertrophy. Left ventricular diastolic parameters were normal. Right Ventricle: The right ventricular size is normal. No increase in right ventricular wall thickness. Right ventricular systolic function is normal. Tricuspid regurgitation signal is inadequate for assessing PA pressure. Left Atrium: Left atrial size was normal in size. Right Atrium: Right atrial size was normal in size. Pericardium: There is no evidence of pericardial effusion. Mitral Valve: The mitral valve is normal in structure. Trivial mitral valve regurgitation. Tricuspid Valve: The tricuspid valve is normal in structure. Tricuspid valve regurgitation is trivial. Aortic Valve: The aortic valve is tricuspid. Aortic valve regurgitation is not visualized. No aortic stenosis is present. Pulmonic Valve: The pulmonic valve was not well visualized. Pulmonic valve regurgitation is not visualized. Aorta: The aortic root and ascending aorta are structurally normal, with no evidence of dilitation. Venous: The inferior vena  cava is normal in size with greater than 50% respiratory variability, suggesting right atrial pressure of 3 mmHg. IAS/Shunts: No atrial level shunt detected by color flow Doppler.  LEFT VENTRICLE PLAX 2D LVIDd:         4.60 cm         Diastology LVIDs:         2.90 cm         LV e' medial:    10.20 cm/s LV PW:         0.90 cm         LV E/e' medial:  7.9 LV IVS:        0.50 cm         LV e' lateral:   15.00 cm/s LVOT diam:     1.70 cm         LV E/e' lateral: 5.3 LV SV:         44 LV SV Index:   26              2D LVOT Area:     2.27 cm        Longitudinal                                Strain                                2D Strain GLS  -21.3 %                                (A2C):                                2D Strain GLS  -21.3 %                                (  A3C):                                2D Strain GLS  -23.0 %                                (A4C):                                2D Strain GLS  -21.9 %                                Avg:                                 3D Volume EF                                LV 3D EF:    Left                                             ventricular                                             ejection                                             fraction by                                             3D volume                                             is 66 %.                                 3D Volume EF:                                3D EF:        66 %                                LV EDV:       103 ml                                LV ESV:       35 ml  LV SV:        68 ml RIGHT VENTRICLE             IVC RV Basal diam:  3.30 cm     IVC diam: 0.90 cm RV S prime:     13.80 cm/s TAPSE (M-mode): 3.0 cm LEFT ATRIUM             Index       RIGHT ATRIUM           Index LA diam:        3.80 cm 2.24 cm/m  RA Area:     10.50 cm LA Vol (A2C):   35.3 ml 20.77 ml/m RA Volume:   22.40 ml  13.18 ml/m LA Vol (A4C):   31.5 ml 18.54 ml/m LA  Biplane Vol: 33.5 ml 19.71 ml/m  AORTIC VALVE LVOT Vmax:   89.70 cm/s LVOT Vmean:  67.000 cm/s LVOT VTI:    0.192 m  AORTA Ao Root diam: 2.80 cm Ao Asc diam:  2.90 cm MITRAL VALVE MV Area (PHT): 4.15 cm    SHUNTS MV Decel Time: 183 msec    Systemic VTI:  0.19 m MV E velocity: 80.10 cm/s  Systemic Diam: 1.70 cm MV A velocity: 72.40 cm/s MV E/A ratio:  1.11 Oswaldo Milian MD Electronically signed by Oswaldo Milian MD Signature Date/Time: 01/05/2021/10:42:16 PM    Final    LONG TERM MONITOR (3-14 DAYS)  Result Date: 12/29/2020  Patient had a minimum heart rate of 41 bpm, maximum heart rate of 155 bpm, and average heart rate of 75 bpm.  Predominant underlying rhythm was sinus rhythm.  Isolated PACs were rare (<1.0%).  Isolated PVCs were rare (<1.0%), with rare couplets present.  No evidence of complete heart block .  Triggered and diary events associated with sinus rhythm, sinus bradycardia, sinus tachycardia (34 events), PACs, and PVCs (10 events).  No malignant arrhythmias.    Assessment & Plan:   History of COVID-19 Sleep disturbance Fatigue Deconditioning Memory loss Snoring Shortness of breath:   Stay well hydrated  Stay active  Deep breathing exercises  May start vitamin C daily, vitamin D3 daily, Zinc daily  May take tylenol for fever or pain  Will place referral to PT  Will place referral to speech for cognitive rehabilitation  Will place referral to pulmonary - may need PFT and sleep eval  Will place referral to EP - second opinion  Will check labs  Please try melatonin 1 mg with magnesium glycinate 400 mg 1 hour before bedtime    Follow up:  Follow up in 3 months or sooner if needed      Fenton Foy, NP 01/26/2021

## 2021-01-15 LAB — CBC
Hematocrit: 41.9 % (ref 34.0–46.6)
Hemoglobin: 13.6 g/dL (ref 11.1–15.9)
MCH: 28.6 pg (ref 26.6–33.0)
MCHC: 32.5 g/dL (ref 31.5–35.7)
MCV: 88 fL (ref 79–97)
Platelets: 261 10*3/uL (ref 150–450)
RBC: 4.75 x10E6/uL (ref 3.77–5.28)
RDW: 13 % (ref 11.7–15.4)
WBC: 7.7 10*3/uL (ref 3.4–10.8)

## 2021-01-15 LAB — COMPREHENSIVE METABOLIC PANEL
ALT: 19 IU/L (ref 0–32)
AST: 18 IU/L (ref 0–40)
Albumin/Globulin Ratio: 2.2 (ref 1.2–2.2)
Albumin: 4.8 g/dL (ref 3.8–4.8)
Alkaline Phosphatase: 92 IU/L (ref 44–121)
BUN/Creatinine Ratio: 9 (ref 9–23)
BUN: 7 mg/dL (ref 6–20)
Bilirubin Total: 1 mg/dL (ref 0.0–1.2)
CO2: 19 mmol/L — ABNORMAL LOW (ref 20–29)
Calcium: 9.9 mg/dL (ref 8.7–10.2)
Chloride: 98 mmol/L (ref 96–106)
Creatinine, Ser: 0.78 mg/dL (ref 0.57–1.00)
Globulin, Total: 2.2 g/dL (ref 1.5–4.5)
Glucose: 87 mg/dL (ref 65–99)
Potassium: 4.3 mmol/L (ref 3.5–5.2)
Sodium: 137 mmol/L (ref 134–144)
Total Protein: 7 g/dL (ref 6.0–8.5)
eGFR: 104 mL/min/{1.73_m2} (ref >59)

## 2021-01-15 LAB — THYROID PANEL WITH TSH
Free Thyroxine Index: 2.2 (ref 1.2–4.9)
T3 Uptake Ratio: 27 % (ref 24–39)
T4, Total: 8.3 ug/dL (ref 4.5–12.0)
TSH: 1.17 u[IU]/mL (ref 0.450–4.500)

## 2021-01-26 DIAGNOSIS — Z8616 Personal history of COVID-19: Secondary | ICD-10-CM | POA: Insufficient documentation

## 2021-01-26 DIAGNOSIS — R0683 Snoring: Secondary | ICD-10-CM | POA: Insufficient documentation

## 2021-01-26 DIAGNOSIS — R5381 Other malaise: Secondary | ICD-10-CM | POA: Insufficient documentation

## 2021-01-26 DIAGNOSIS — I499 Cardiac arrhythmia, unspecified: Secondary | ICD-10-CM | POA: Insufficient documentation

## 2021-01-26 DIAGNOSIS — R413 Other amnesia: Secondary | ICD-10-CM | POA: Insufficient documentation

## 2021-01-26 DIAGNOSIS — R0602 Shortness of breath: Secondary | ICD-10-CM | POA: Insufficient documentation

## 2021-01-26 DIAGNOSIS — G4733 Obstructive sleep apnea (adult) (pediatric): Secondary | ICD-10-CM | POA: Insufficient documentation

## 2021-01-26 NOTE — Assessment & Plan Note (Signed)
Sleep disturbance Fatigue Deconditioning Memory loss Snoring Shortness of breath:   Stay well hydrated  Stay active  Deep breathing exercises  May start vitamin C daily, vitamin D3 daily, Zinc daily  May take tylenol for fever or pain  Will place referral to PT  Will place referral to speech for cognitive rehabilitation  Will place referral to pulmonary - may need PFT and sleep eval  Will place referral to EP - second opinion  Will check labs  Please try melatonin 1 mg with magnesium glycinate 400 mg 1 hour before bedtime    Follow up:  Follow up in 3 months or sooner if needed

## 2021-02-03 ENCOUNTER — Encounter: Payer: Self-pay | Admitting: Pulmonary Disease

## 2021-02-03 ENCOUNTER — Other Ambulatory Visit: Payer: Self-pay

## 2021-02-03 ENCOUNTER — Ambulatory Visit (INDEPENDENT_AMBULATORY_CARE_PROVIDER_SITE_OTHER): Payer: BC Managed Care – PPO | Admitting: Pulmonary Disease

## 2021-02-03 DIAGNOSIS — I7300 Raynaud's syndrome without gangrene: Secondary | ICD-10-CM | POA: Insufficient documentation

## 2021-02-03 DIAGNOSIS — R0602 Shortness of breath: Secondary | ICD-10-CM | POA: Diagnosis not present

## 2021-02-03 DIAGNOSIS — G4733 Obstructive sleep apnea (adult) (pediatric): Secondary | ICD-10-CM

## 2021-02-03 NOTE — Patient Instructions (Signed)
Home sleep test  Ambulatory sat  Spirometry pre-post (after exercise )  Blood work - ANA, CCP, ESR

## 2021-02-03 NOTE — Assessment & Plan Note (Addendum)
Given excessive daytime somnolence, narrow pharyngeal exam, witnessed apneas & loud snoring, obstructive sleep apnea is very likely & an overnight polysomnogram will be scheduled as a home study. The pathophysiology of obstructive sleep apnea , it's cardiovascular consequences & modes of treatment including CPAP were discused with the patient in detail & they evidenced understanding.  Pretest probability is high 

## 2021-02-03 NOTE — Progress Notes (Signed)
Subjective:    Patient ID: Savannah Beck, female    DOB: [birth year], 30 y.o.   MRN: [id number]  HPI  Chief Complaint  Patient presents with  . Consult    Shortness of breath since January after being diagnosed with COVID. She is also having insomnia since January.    30 year old never smoker presents for evaluation of dyspnea, sleep problems which she associates with post-COVID symptoms. She was vaccinated in 10/2019, not boosted.  She had COVID infection in January 2022, she was ill for a couple of weeks but did not require hospitalization.  She treated herself with OTC medications. Since then she reports increased shortness of breath.  She reports this is worse on climbing stairs and with exercise and she reports intermittent wheezing.  She underwent cardiac evaluation which included a 14-day Zio patch for palpitations this did not pick up on any arrhythmias.  Echo showed normal LV function. CT angiogram in March 2022 showed clear lungs and no evidence of pulm emboli  I have reviewed notes from cardiology and post-COVID clinic. She also reports increased fatigue and some memory loss.  There is a history of history of Raynaud's syndrome which was evaluated by her PCP.  She has a twin sister who has been diagnosed with connective tissue disease, undifferentiated. She has a prior history of tick bite.  Regarding sleep she reports history of insomnia around her menstrual cycle.  This history is longstanding.  But post COVID she has developed gasping and snoring episodes.  Husband states that she snores loudly and has witnessed apneas. Bedtime is between 9 and 11 PM, sleep latency is minimal but can vary sometimes up to an hour.  She sleeps on her right side and ends up on her stomach.  She rarely sleeps on her back due to increased gasping episodes in her sleep.  She reports 1-3 nocturnal awakenings and is out of bed latest by 7:30 AM feeling tired with dryness of mouth, denies headaches. There  is no history suggestive of cataplexy, sleep paralysis or parasomnias  She used a hormone implant for contraception and has gained 30 pounds since 2016. She has decreased use of caffeinated beverages. She was prescribed melatonin but was hesitant to try this.  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 Allergic rhinitis   . Bilateral ovarian cysts   . Hyperlipidemia    Past Surgical History:  Procedure Laterality Date  . ANGIOMA CAUTERY Right     Allergies  Allergen Reactions  . Dicyclomine Hcl Other (See Comments)  . Hyoscyamine Other (See Comments)  . Other Itching    Cilantro-   . Pine Other (See Comments)  . Amoxicillin Hives and Rash    Reaction occurred to patient's identical twin sister: Rash up and down both legs instantly (from Amoxicillin)  . Penicillins Hives and Rash    Reaction (to Amoxicillin) occurred to patient's identical twin sister: Did it involve swelling of the face/tongue/throat, SOB, or low BP? No Did it involve sudden or severe rash/hives, skin peeling, or any reaction on the inside of your mouth or nose? Unk Did you need to seek medical attention at a hospital or doctor's office? Yes When did it  last happen? "years ago, to identical sister" If all above answers are "NO", may proceed with cephalosporin use.    Social History   Socioeconomic History  . Marital status: Married    Spouse name: Not on file  . Number of children: Not on file  . Years of education: Not on file  . Highest education level: Not on file  Occupational History  . Not on file  Tobacco Use  . Smoking status: Never Smoker  . Smokeless tobacco: Never Used  Vaping Use  . Vaping  Use: Never used  Substance and Sexual Activity  . Alcohol use: Yes    Alcohol/week: 0.0 standard drinks    Comment: once a month  . Drug use: No  . Sexual activity: Not on file  Other Topics Concern  . Not on file  Social History Narrative  . Not on file   Social Determinants of Health   Financial Resource Strain: Not on file  Food Insecurity: Not on file  Transportation Needs: Not on file  Physical Activity: Not on file  Stress: Not on file  Social Connections: Not on file  Intimate Partner Violence: Not on file    Family History  Problem Relation Age of Onset  . Thyroid disease Mother   . Diabetes Mother   . Cancer Father   . Diabetes Other   . CAD Other   . Alzheimer's disease Other   . Asthma Other   . Hypertension Other   . Breast cancer Paternal Grandmother       Review of Systems Constitutional: negative for anorexia, fevers and sweats  Eyes: negative for irritation, redness and visual disturbance  Ears, nose, mouth, throat, and face: negative for earaches, epistaxis, nasal congestion and sore throat  Respiratory: negative for cough,  sputum  Cardiovascular: negative for chest pain,  lower extremity edema, orthopnea, palpitations and syncope  Gastrointestinal: negative for abdominal pain, constipation, diarrhea, melena, nausea and vomiting  Genitourinary:negative for dysuria, frequency and hematuria  Hematologic/lymphatic: negative for bleeding, easy bruising and lymphadenopathy  Musculoskeletal:negative for arthralgias, muscle weakness and stiff joints  Neurological: negative for coordination problems, gait problems, headaches and weakness  Endocrine: negative for diabetic symptoms including polydipsia, polyuria and weight loss     Objective:   Physical Exam  Gen. Pleasant, obese, in no distress, normal affect ENT - no pallor,icterus, no post nasal drip, class 2 airway, enlarged tonsils Neck: No JVD, no thyromegaly, no carotid bruits Lungs: no use of  accessory muscles, no dullness to percussion, decreased without rales or rhonchi  Cardiovascular: Rhythm regular, heart sounds  normal, no murmurs or gallops, no peripheral edema Abdomen: soft and non-tender, no hepatosplenomegaly, BS normal. Musculoskeletal: No deformities, no cyanosis or clubbing Neuro:  alert, non focal, no tremors       Assessment & Plan:    Assessment:   . 1 or more chronic illnesses with severe exacerbation, progression, or side effects of treatment;  . 1 acute or chronic illness or injury that poses a threat to life or bodily function  Plan Following Extensive Data Review & Interpretation:  . I reviewed prior external note(s) from post-COVID clinic, cardiology . I reviewed the result(s) of echo, CT angiogram . I have ordered home sleep test, spirometry, blood work  Independent interpretation of tests . Review of patient's CT images revealed no pulmonary emboli, clear lungs. The patient's images have been independently reviewed by me.    Discussion of management or test interpretation with another colleague PCP.

## 2021-02-03 NOTE — Assessment & Plan Note (Signed)
This could be post-COVID dyspnea.  Fortunately CT angiogram has not shown any evidence of emboli or fibrosis.  Question of exercise-induced asthma has been raised due to intermittent wheezing. Will obtain a spirometry and repeat this post exercise to look for airway obstruction.

## 2021-02-03 NOTE — Assessment & Plan Note (Signed)
Due to history of Raynaud's phenomenon, twin sister having connective tissue disease, and her vague symptoms of fatigue, joint pains, reasonable to get screening test with ANA and CCP

## 2021-02-04 LAB — SEDIMENTATION RATE: Sed Rate: 6 mm/hr (ref 0–20)

## 2021-02-05 LAB — ANTI-NUCLEAR AB-TITER (ANA TITER): ANA Titer 1: 1:80 {titer} — ABNORMAL HIGH

## 2021-02-05 LAB — ANA: Anti Nuclear Antibody (ANA): POSITIVE — AB

## 2021-02-05 LAB — CYCLIC CITRUL PEPTIDE ANTIBODY, IGG: Cyclic Citrullin Peptide Ab: <16 UNITS

## 2021-02-08 ENCOUNTER — Institutional Professional Consult (permissible substitution): Payer: BC Managed Care – PPO | Admitting: Cardiology

## 2021-02-09 DIAGNOSIS — M79605 Pain in left leg: Secondary | ICD-10-CM | POA: Diagnosis not present

## 2021-02-09 DIAGNOSIS — R600 Localized edema: Secondary | ICD-10-CM | POA: Diagnosis not present

## 2021-02-10 ENCOUNTER — Other Ambulatory Visit: Payer: Self-pay

## 2021-02-10 DIAGNOSIS — I7300 Raynaud's syndrome without gangrene: Secondary | ICD-10-CM

## 2021-02-10 NOTE — Progress Notes (Signed)
Called and went over lab results per Dr Elsworth Soho with patient. All questions answered and patient expressed full understanding of results and Dr Bari Mantis recommendations. Patient agreeable to Rheumatology referral being placed. Order placed per Dr Elsworth Soho. Confirmed upcoming scheduled PFT and office with with Dr Elsworth Soho on 03/17/21. Nothing further needed at this time.

## 2021-03-15 ENCOUNTER — Other Ambulatory Visit (HOSPITAL_COMMUNITY): Payer: BC Managed Care – PPO

## 2021-03-17 ENCOUNTER — Ambulatory Visit (INDEPENDENT_AMBULATORY_CARE_PROVIDER_SITE_OTHER): Payer: BC Managed Care – PPO | Admitting: Pulmonary Disease

## 2021-03-17 ENCOUNTER — Ambulatory Visit: Payer: BC Managed Care – PPO

## 2021-03-17 ENCOUNTER — Other Ambulatory Visit: Payer: Self-pay

## 2021-03-17 ENCOUNTER — Encounter: Payer: Self-pay | Admitting: Pulmonary Disease

## 2021-03-17 DIAGNOSIS — G4733 Obstructive sleep apnea (adult) (pediatric): Secondary | ICD-10-CM

## 2021-03-17 DIAGNOSIS — R0602 Shortness of breath: Secondary | ICD-10-CM | POA: Diagnosis not present

## 2021-03-17 DIAGNOSIS — R0683 Snoring: Secondary | ICD-10-CM | POA: Diagnosis not present

## 2021-03-17 LAB — PULMONARY FUNCTION TEST
FEF 25-75 Post: 4.05 L/sec
FEF 25-75 Pre: 3.51 L/sec
FEF2575-%Change-Post: 15 %
FEF2575-%Pred-Post: 130 %
FEF2575-%Pred-Pre: 113 %
FEV1-%Change-Post: 2 %
FEV1-%Pred-Post: 103 %
FEV1-%Pred-Pre: 101 %
FEV1-Post: 2.73 L
FEV1-Pre: 2.66 L
FEV1FVC-%Change-Post: 3 %
FEV1FVC-%Pred-Pre: 102 %
FEV6-%Change-Post: 0 %
FEV6-%Pred-Post: 99 %
FEV6-%Pred-Pre: 99 %
FEV6-Post: 3.05 L
FEV6-Pre: 3.05 L
FEV6FVC-%Pred-Post: 100 %
FEV6FVC-%Pred-Pre: 100 %
FVC-%Change-Post: 0 %
FVC-%Pred-Post: 99 %
FVC-%Pred-Pre: 100 %
FVC-Post: 3.05 L
FVC-Pre: 3.07 L
Post FEV1/FVC ratio: 90 %
Post FEV6/FVC ratio: 100 %
Pre FEV1/FVC ratio: 87 %
Pre FEV6/FVC Ratio: 100 %

## 2021-03-17 NOTE — Progress Notes (Signed)
   Subjective:    Patient ID: Savannah Beck, female    DOB: 1990/11/28, 30 y.o.   MRN: [id number]  HPI   30 yo never smoker for FU of dyspnea, sleep problems which she associates with post-COVID symptoms. She was vaccinated in 10/2019, not boosted.  She had COVID infection in January 2022, she was ill for a couple of weeks but did not require hospitalization.  She treated herself with OTC medications. Since then she reports increased shortness of breath.  She reports this is worse on climbing stairs and with exercise and she reports intermittent wheezing.  She underwent cardiac evaluation which included a 14-day Zio patch for palpitations this did not pick up on any arrhythmias.  Echo showed normal LV function.  She arrives with her husband Savannah Beck today. Shortness of breath is persistent. She reports bilateral chest pains not related to exertion as if her lungs were burning or freezing.  She has SCDs for chronic venous insufficiency, she put these on an seem to relieve her symptoms.  We reviewed spirometry today and previous blood work She also reports multiple other symptoms and admits to increased level of anxiety.  She provides history is history of Raynaud's phenomenon Her twin sister has been diagnosed with MCTD  Significant tests/ events reviewed  HST pending Spirometry 02/2021 normal, no airway obstruction CT angiogram in March 2022 showed clear lungs and no evidence of pulm emboli  ANA 1: 80 speckled CCP neg   Review of Systems neg for any significant sore throat, dysphagia, itching, sneezing, nasal congestion or excess/ purulent secretions, fever, chills, sweats, unintended wt loss, pleuritic or exertional cp, hempoptysis, orthopnea pnd or change in chronic leg swelling. Also denies presyncope, palpitations, heartburn, abdominal pain, nausea, vomiting, diarrhea or change in bowel or urinary habits, dysuria,hematuria, rash, arthralgias, visual complaints, headache, numbness weakness  or ataxia.     Objective:   Physical Exam  Gen. Pleasant, obese, in no distress ENT - no lesions, no post nasal drip Neck: No JVD, no thyromegaly, no carotid bruits Lungs: no use of accessory muscles, no dullness to percussion, decreased without rales or rhonchi  Cardiovascular: Rhythm regular, heart sounds  normal, no murmurs or gallops, 1+ peripheral edema Musculoskeletal: No deformities, no cyanosis or clubbing , no tremors       Assessment & Plan:

## 2021-03-17 NOTE — Progress Notes (Signed)
Spirometry pre and post performed today. 

## 2021-03-17 NOTE — Patient Instructions (Signed)
Spirometry pre and post performed today. 

## 2021-03-17 NOTE — Patient Instructions (Signed)
Home sleep test today Tylenol 500 twice daily as needed for  pain

## 2021-03-18 NOTE — Assessment & Plan Note (Signed)
No clear etiology identified.  Cardiac work-up has been negative.  CT angio did not show any evidence of pulm emboli, spirometry does not show any evidence of airway obstruction. Post COVID syndrome possible although her COVID illness was not severe, differential also includes some level of deconditioning and anxiety She will be referred to rheumatology for positive ANA in the setting of Raynaud's syndrome and a twin sister that has been diagnosed with MCTD

## 2021-03-18 NOTE — Assessment & Plan Note (Signed)
Given excessive daytime somnolence, narrow pharyngeal exam & loud snoring, obstructive sleep apnea is very likely & an overnight polysomnogram will be scheduled as a home study. The pathophysiology of obstructive sleep apnea , it's cardiovascular consequences & modes of treatment including CPAP were discused with the patient in detail & they evidenced understanding.

## 2021-03-21 NOTE — Progress Notes (Signed)
Office Visit Note  Patient: Savannah Beck             Date of Birth: [birth year]           MRN: [id number]             PCP: London Pepper, MD Referring: Rigoberto Noel, MD Visit Date: 03/22/2021 Occupation: Geneticist, molecular  Subjective:   History of Present Illness: Alessandria Henken is a 30 y.o. female here for positive ANA with symptoms of raynaud's and fatigue with family history of undifferentiated connective tissue disease.  Symptoms are pretty much all do since her infection with COVID in January of this year.  Before this year she had chronic venous insufficiency of the legs with chronic lymphedema in the left leg after ablation therapy.  This was pretty severe with about 3 weeks of symptoms including dyspnea and chest pains but did not require hospitalization.  After recovering from this she continues to feel some shortness of breath with exertion but no other ongoing respiratory symptoms.  She does describe a period recently with about a week of sharp chest pain on deep inspiration but did not change positionally.  She has noticed what she describes as Raynaud's symptoms with hand discoloration with cold exposure with pallor extending proximal to the MCP joints symptoms usually resolving within about 5 or 10 minutes after starting to rewarm.  She also describes easy bruising throughout her extremities with very minor traumas.  She notices stiffness and swelling in the proximal hand joints with some erythema first thing in the morning.  Stiffness and swelling improved but also has joint tenderness continuing throughout most of the days.   Labs reviewed 01/2021 ANA 1:80 speckled RF neg CCP neg ESR 6  Activities of Daily Living:  Patient reports morning stiffness for 10-15 minutes.   Patient Denies nocturnal pain.  Difficulty dressing/grooming: Denies Difficulty climbing stairs: Reports Difficulty getting out of chair: Denies Difficulty using hands for taps, buttons, cutlery,  and/or writing: Reports  Review of Systems  Constitutional:  Positive for fatigue.  HENT:  Positive for mouth dryness and nose dryness. Negative for mouth sores.   Eyes:  Positive for dryness. Negative for pain and itching.  Respiratory:  Positive for shortness of breath. Negative for difficulty breathing.        With exertion   Cardiovascular:  Positive for chest pain and swelling in legs/feet. Negative for palpitations.  Gastrointestinal:  Negative for blood in stool, constipation and diarrhea.  Endocrine: Negative for increased urination.  Genitourinary:  Negative for difficulty urinating.  Musculoskeletal:  Positive for joint pain, joint pain, joint swelling, myalgias, morning stiffness and myalgias. Negative for muscle tenderness.  Skin:  Positive for color change. Negative for rash and redness.  Allergic/Immunologic: Negative for susceptible to infections.  Neurological:  Positive for numbness and memory loss. Negative for dizziness and headaches.  Hematological:  Positive for bruising/bleeding tendency.  Psychiatric/Behavioral:  Positive for confusion.    PMFS History:  Patient Active Problem List   Diagnosis Date Noted   Positive ANA (antinuclear antibody) 03/22/2021   Arthralgia 03/22/2021   Raynaud phenomenon 02/03/2021   History of COVID-19 01/26/2021   Physical deconditioning 01/26/2021   Memory loss 01/26/2021   Irregular heart rate 01/26/2021   OSA (obstructive sleep apnea) 01/26/2021   SOB (shortness of breath) 12/03/2020   Palpitations 12/03/2020   Morbid obesity ([place]) 12/03/2020   Mixed hyperlipidemia 12/03/2020   Patellar malalignment syndrome, right 01/31/2012   ANKLE PAIN, RIGHT  07/01/2010   OTHER ENTHESOPATHY OF ANKLE AND TARSUS 07/01/2010    Past Medical History:  Diagnosis Date   Allergic rhinitis    Bilateral ovarian cysts    Hyperlipidemia     Family History  Problem Relation Age of Onset   Thyroid disease Mother    Diabetes Mother    Cancer  Father    Kidney cancer Father    Melanoma Father    Polycystic ovary syndrome Sister    Fibromyalgia Sister    Migraines Sister    Bipolar disorder Sister    Migraines Sister    Asthma Sister    Fibromyalgia Sister    Depression Brother    Anxiety disorder Brother    Anxiety disorder Brother    Depression Brother    Breast cancer Paternal Grandmother    Diabetes Other    CAD Other    Alzheimer's disease Other    Asthma Other    Hypertension Other    Past Surgical History:  Procedure Laterality Date   ANGIOMA CAUTERY Right    LASER ABLATION OF VASCULAR LESION Left    x2   Social History   Social History Narrative   Not on file   Immunization History  Administered Date(s) Administered   Hepatitis B 03/30/2003   Influenza,inj,Quad PF,6+ Mos 05/09/2017, 06/04/2018, 07/04/2019   MMR 12/28/1998   PFIZER(Purple Top)SARS-COV-2 Vaccination 11/14/2019, 12/05/2019   Tdap 11/28/2011     Objective: Vital Signs: BP 118/86 (BP Location: Right Arm, Patient Position: Sitting, Cuff Size: Normal)   Pulse 76   Ht _0  (1.473 m)   Wt 184 lb (83.5 kg)   BMI 38.46 kg/m    Physical Exam Constitutional:      Appearance: She is obese.  Eyes:     Conjunctiva/sclera: Conjunctivae normal.  Cardiovascular:     Rate and Rhythm: Normal rate and regular rhythm.  Pulmonary:     Effort: Pulmonary effort is normal.     Breath sounds: Normal breath sounds.  Skin:    General: Skin is warm and dry.     Comments: Normal nailfold capillaroscopy No digital or nail pitting  Neurological:     General: No focal deficit present.     Mental Status: She is alert.     Deep Tendon Reflexes: Reflexes normal.  Psychiatric:        Mood and Affect: Mood normal.     Musculoskeletal Exam:  Shoulders and elbows normal ROM. Right wrist tenderness without swelling Tenderness to palpation over MCP joints bilaterally without synovitis, normal ROM, ultrasound exam suggestive for chronic synovial  thickening without effusion and without color doppler enhancement Left knee tenderness to medial joint palpation, normla ROM b/l no effusions No ankle or MTP tenderness  Investigation: No additional findings.  Imaging: No results found.  Recent Labs: Lab Results  Component Value Date   WBC 7.7 01/14/2021   HGB 13.6 01/14/2021   PLT 261 01/14/2021   NA 137 01/14/2021   K 4.3 01/14/2021   CL 98 01/14/2021   CO2 19 (L) 01/14/2021   GLUCOSE 87 01/14/2021   BUN 7 01/14/2021   CREATININE 0.78 01/14/2021   BILITOT 1.0 01/14/2021   ALKPHOS 92 01/14/2021   AST 18 01/14/2021   ALT 19 01/14/2021   PROT 7.0 01/14/2021   ALBUMIN 4.8 01/14/2021   CALCIUM 9.9 01/14/2021   GFRAA >60 03/09/2020    Speciality Comments: No specialty comments available.  Procedures:  No procedures performed Allergies: Dicyclomine hcl, Hyoscyamine, Other,  Pine, Amoxicillin, and Penicillins   Assessment / Plan:     Visit Diagnoses: Positive ANA (antinuclear antibody) - Plan: Anti-scleroderma antibody, RNP Antibody, Anti-Smith antibody, Sjogrens syndrome-A extractable nuclear antibody, Sjogrens syndrome-B extractable nuclear antibody, Anti-DNA antibody, double-stranded, C3 and C4, Urinalysis, Sedimentation rate, C-reactive protein  Positive ANA with various systemic symptoms although does not describe a typical clinical picture of connective tissue disease at this time.  We will check specific ENA antibodies today also complements urinalysis and inflammatory markers.  Symptoms may represent a reactive process from COVID infection earlier this year versus early symptoms of a connective tissue disease.  Raynaud's phenomenon without gangrene  Raynaud's symptoms new this year without ischemic changes visualized and normal nailfold capillaroscopy.  Symptoms do not currently bother her enough to warrant trial of treatment.  Could be a candidate especially if symptoms worsen with winter though calcium channel  blockers could exacerbate chronic venous insufficiency symptoms in her legs.  Arthralgia, unspecified joint  Joint pains particularly in bilateral hand joints patient with pictures of morning erythema and swelling although tenderness on exam today without any obvious synovitis.  Orders: Orders Placed This Encounter  Procedures   Anti-scleroderma antibody   RNP Antibody   Anti-Smith antibody   Sjogrens syndrome-A extractable nuclear antibody   Sjogrens syndrome-B extractable nuclear antibody   Anti-DNA antibody, double-stranded   C3 and C4   Urinalysis   Sedimentation rate   C-reactive protein   No orders of the defined types were placed in this encounter.    Follow-Up Instructions: No follow-ups on file.   Collier Salina, MD  Note - This record has been created using [employer].  Chart creation errors have been sought, but may not always  have been located. Such creation errors do not reflect on  the standard of medical care.

## 2021-03-22 ENCOUNTER — Other Ambulatory Visit: Payer: Self-pay

## 2021-03-22 ENCOUNTER — Telehealth: Payer: Self-pay | Admitting: Pulmonary Disease

## 2021-03-22 ENCOUNTER — Encounter: Payer: Self-pay | Admitting: Internal Medicine

## 2021-03-22 ENCOUNTER — Ambulatory Visit (INDEPENDENT_AMBULATORY_CARE_PROVIDER_SITE_OTHER): Payer: BC Managed Care – PPO | Admitting: Internal Medicine

## 2021-03-22 VITALS — BP 118/86 | HR 76 | Ht <= 58 in | Wt 184.0 lb

## 2021-03-22 DIAGNOSIS — R768 Other specified abnormal immunological findings in serum: Secondary | ICD-10-CM

## 2021-03-22 DIAGNOSIS — R0683 Snoring: Secondary | ICD-10-CM | POA: Diagnosis not present

## 2021-03-22 DIAGNOSIS — I7300 Raynaud's syndrome without gangrene: Secondary | ICD-10-CM

## 2021-03-22 DIAGNOSIS — M2550 Pain in unspecified joint: Secondary | ICD-10-CM | POA: Insufficient documentation

## 2021-03-22 NOTE — Telephone Encounter (Signed)
HST did not show any evidence of OSA or oxygen drop Just focus on weight loss & sleep hygiene measures

## 2021-03-23 LAB — URINALYSIS
Bilirubin Urine: NEGATIVE
Glucose, UA: NEGATIVE
Hgb urine dipstick: NEGATIVE
Ketones, ur: NEGATIVE
Nitrite: NEGATIVE
Protein, ur: NEGATIVE
Specific Gravity, Urine: 1.009 (ref 1.001–1.035)
pH: 6 (ref 5.0–8.0)

## 2021-03-23 LAB — ANTI-SMITH ANTIBODY: ENA SM Ab Ser-aCnc: <1 AI

## 2021-03-23 LAB — SEDIMENTATION RATE: Sed Rate: 9 mm/h (ref 0–20)

## 2021-03-23 LAB — ANTI-SCLERODERMA ANTIBODY: Scleroderma (Scl-70) (ENA) Antibody, IgG: <1 AI

## 2021-03-23 LAB — C3 AND C4
C3 Complement: 136 mg/dL (ref 83–193)
C4 Complement: 24 mg/dL (ref 15–57)

## 2021-03-23 LAB — RNP ANTIBODY: Ribonucleic Protein(ENA) Antibody, IgG: <1 AI

## 2021-03-23 LAB — C-REACTIVE PROTEIN: CRP: 2.5 mg/L (ref <8.0)

## 2021-03-23 LAB — ANTI-DNA ANTIBODY, DOUBLE-STRANDED: ds DNA Ab: <1 IU/mL

## 2021-03-23 LAB — SJOGRENS SYNDROME-A EXTRACTABLE NUCLEAR ANTIBODY: SSA (Ro) (ENA) Antibody, IgG: <1 AI

## 2021-03-23 LAB — SJOGRENS SYNDROME-B EXTRACTABLE NUCLEAR ANTIBODY: SSB (La) (ENA) Antibody, IgG: <1 AI

## 2021-03-23 NOTE — Telephone Encounter (Signed)
Spoke with the pt and notified of results. She verbalized understanding. Will call prn.

## 2021-04-14 ENCOUNTER — Telehealth: Payer: Self-pay | Admitting: Pulmonary Disease

## 2021-04-14 NOTE — Telephone Encounter (Signed)
Pt stated that she has an appt with NP Lazaro Arms but will need to push it back stated that she was unable to find any contact information besides our office to get the appt rescheduled. Pls regard; [phone]

## 2021-04-15 NOTE — Telephone Encounter (Signed)
Call returned to patient, confirmed DOB. Made aware NP Nils Pyle is no longer at this clinic and we do not have any way of her contacting her that I am aware of. I was able to use google and find a number for the post covid clinic for her. Patient will try that number.   Nothing further needed at this time.

## 2021-04-16 ENCOUNTER — Ambulatory Visit: Payer: BC Managed Care – PPO

## 2021-05-12 ENCOUNTER — Ambulatory Visit: Payer: Self-pay | Admitting: Internal Medicine

## 2021-05-20 DIAGNOSIS — R319 Hematuria, unspecified: Secondary | ICD-10-CM | POA: Diagnosis not present

## 2021-05-20 DIAGNOSIS — N3000 Acute cystitis without hematuria: Secondary | ICD-10-CM | POA: Diagnosis not present

## 2021-05-20 DIAGNOSIS — N390 Urinary tract infection, site not specified: Secondary | ICD-10-CM | POA: Diagnosis not present

## 2021-05-25 ENCOUNTER — Encounter (HOSPITAL_BASED_OUTPATIENT_CLINIC_OR_DEPARTMENT_OTHER): Payer: Self-pay | Admitting: Emergency Medicine

## 2021-05-25 ENCOUNTER — Other Ambulatory Visit (HOSPITAL_BASED_OUTPATIENT_CLINIC_OR_DEPARTMENT_OTHER): Payer: Self-pay

## 2021-05-25 ENCOUNTER — Emergency Department (HOSPITAL_BASED_OUTPATIENT_CLINIC_OR_DEPARTMENT_OTHER): Payer: BC Managed Care – PPO

## 2021-05-25 ENCOUNTER — Emergency Department (HOSPITAL_BASED_OUTPATIENT_CLINIC_OR_DEPARTMENT_OTHER)
Admission: EM | Admit: 2021-05-25 | Discharge: 2021-05-25 | Disposition: A | Discharge status: Home / Self Care | Payer: BC Managed Care – PPO | Attending: Emergency Medicine | Admitting: Emergency Medicine

## 2021-05-25 ENCOUNTER — Other Ambulatory Visit: Payer: Self-pay

## 2021-05-25 DIAGNOSIS — S29019A Strain of muscle and tendon of unspecified wall of thorax, initial encounter: Secondary | ICD-10-CM

## 2021-05-25 DIAGNOSIS — M549 Dorsalgia, unspecified: Secondary | ICD-10-CM | POA: Diagnosis not present

## 2021-05-25 DIAGNOSIS — Z8616 Personal history of COVID-19: Secondary | ICD-10-CM | POA: Diagnosis not present

## 2021-05-25 DIAGNOSIS — N390 Urinary tract infection, site not specified: Secondary | ICD-10-CM | POA: Insufficient documentation

## 2021-05-25 DIAGNOSIS — R109 Unspecified abdominal pain: Secondary | ICD-10-CM | POA: Diagnosis not present

## 2021-05-25 LAB — COMPREHENSIVE METABOLIC PANEL
ALT: 13 U/L (ref 0–44)
AST: 13 U/L — ABNORMAL LOW (ref 15–41)
Albumin: 4.5 g/dL (ref 3.5–5.0)
Alkaline Phosphatase: 57 U/L (ref 38–126)
Anion gap: 8 (ref 5–15)
BUN: 7 mg/dL (ref 6–20)
CO2: 24 mmol/L (ref 22–32)
Calcium: 9.4 mg/dL (ref 8.9–10.3)
Chloride: 108 mmol/L (ref 98–111)
Creatinine, Ser: 0.79 mg/dL (ref 0.44–1.00)
GFR, Estimated: >60 mL/min (ref >60)
Glucose, Bld: 105 mg/dL — ABNORMAL HIGH (ref 70–99)
Potassium: 3.8 mmol/L (ref 3.5–5.1)
Sodium: 140 mmol/L (ref 135–145)
Total Bilirubin: 1.1 mg/dL (ref 0.3–1.2)
Total Protein: 6.9 g/dL (ref 6.5–8.1)

## 2021-05-25 LAB — CBC
HCT: 39.2 % (ref 36.0–46.0)
Hemoglobin: 13.1 g/dL (ref 12.0–15.0)
MCH: 29.8 pg (ref 26.0–34.0)
MCHC: 33.4 g/dL (ref 30.0–36.0)
MCV: 89.3 fL (ref 80.0–100.0)
Platelets: 249 10*3/uL (ref 150–400)
RBC: 4.39 MIL/uL (ref 3.87–5.11)
RDW: 12.9 % (ref 11.5–15.5)
WBC: 6 10*3/uL (ref 4.0–10.5)
nRBC: 0 % (ref 0.0–0.2)

## 2021-05-25 LAB — URINALYSIS, ROUTINE W REFLEX MICROSCOPIC
Bilirubin Urine: NEGATIVE
Glucose, UA: NEGATIVE mg/dL
Ketones, ur: 15 mg/dL — AB
Nitrite: NEGATIVE
Protein, ur: NEGATIVE mg/dL
Specific Gravity, Urine: <1.005 — ABNORMAL LOW (ref 1.005–1.030)
pH: 6 (ref 5.0–8.0)

## 2021-05-25 LAB — PREGNANCY, URINE: Preg Test, Ur: NEGATIVE

## 2021-05-25 MED ORDER — METHOCARBAMOL 500 MG PO TABS
750.0000 mg | ORAL_TABLET | Freq: Three times a day (TID) | ORAL | 0 refills | Status: AC | PRN
  Filled 2021-05-25: qty 22, 5d supply, fill #0

## 2021-05-25 MED ORDER — SODIUM CHLORIDE 0.9 % IV BOLUS
1000.0000 mL | Freq: Once | INTRAVENOUS | Status: AC
  Administered 2021-05-25: 1000 mL via INTRAVENOUS

## 2021-05-25 NOTE — Discharge Instructions (Addendum)
It was our pleasure to provide your ER care today - we hope that you feel better.  Drink plenty of fluids/stay well hydrated.   Take acetaminophen or ibuprofen as need. You may also try robaxin as need for muscle pain/spasm - no driving when taking.   Follow up with primary care doctor in one week if symptoms fail to improve/resolve.  Return to ER if worse, new symptoms, fevers, new, worsening or severe pain, persistent vomiting, or other concern.

## 2021-05-25 NOTE — ED Provider Notes (Signed)
[facility]ERGENC[facility] DEPT Provider Note   CSN: [id number] Arrival date & time: 05/25/21  1259     History Chief Complaint  Patient presents with   Urinary Tract Infection    Rachele Lamaster is a 30 y.o. female.  Pt c/o recent dx uti 9/22, on macrobid since, and now also c/o left flank pain. Symptoms acute onset, moderate, persistent. Remote hx pyelo. Denies hx kidney stones. Denies fever/chills or diaphoresis.  no vomiting. Having normal bms. No vaginal discharge or bleeding.   The history is provided by the patient and medical records.  Urinary Tract Infection Associated symptoms: abdominal pain and flank pain   Associated symptoms: no fever and no vomiting       Past Medical History:  Diagnosis Date   Allergic rhinitis    Bilateral ovarian cysts    Hyperlipidemia     Patient Active Problem List   Diagnosis Date Noted   Positive ANA (antinuclear antibody) 03/22/2021   Arthralgia 03/22/2021   Raynaud phenomenon 02/03/2021   History of COVID-19 01/26/2021   Physical deconditioning 01/26/2021   Memory loss 01/26/2021   Irregular heart rate 01/26/2021   OSA (obstructive sleep apnea) 01/26/2021   SOB (shortness of breath) 12/03/2020   Palpitations 12/03/2020   Morbid obesity ([place]) 12/03/2020   Mixed hyperlipidemia 12/03/2020   Patellar malalignment syndrome, right 01/31/2012   ANKLE PAIN, RIGHT 07/01/2010   OTHER ENTHESOPATHY OF ANKLE AND TARSUS 07/01/2010    Past Surgical History:  Procedure Laterality Date   ANGIOMA CAUTERY Right    LASER ABLATION OF VASCULAR LESION Left    x2     OB History   No obstetric history on file.     Family History  Problem Relation Age of Onset   Thyroid disease Mother    Diabetes Mother    Cancer Father    Kidney cancer Father    Melanoma Father    Polycystic ovary syndrome Sister    Fibromyalgia Sister    Migraines Sister    Bipolar disorder Sister    Migraines Sister    Asthma Sister    Fibromyalgia  Sister    Depression Brother    Anxiety disorder Brother    Anxiety disorder Brother    Depression Brother    Breast cancer Paternal Grandmother    Diabetes Other    CAD Other    Alzheimer's disease Other    Asthma Other    Hypertension Other     Social History   Tobacco Use   Smoking status: Never   Smokeless tobacco: Never  Vaping Use   Vaping Use: Never used  Substance Use Topics   Alcohol use: Yes    Comment: once or twice weekly   Drug use: No    Home Medications Prior to Admission medications   Medication Sig Start Date End Date Taking? Authorizing Provider  cetirizine (ZYRTEC) 10 MG tablet Take 10 mg by mouth daily.   Yes [provider]  fluticasone (FLONASE) 50 MCG/ACT nasal spray Place 1-2 sprays into both nostrils daily as needed for allergies or rhinitis.    Yes [provider]  hydrOXYzine (ATARAX/VISTARIL) 50 MG tablet Take 50 mg by mouth daily as needed for anxiety.   Yes [provider]  nitrofurantoin, macrocrystal-monohydrate, (MACROBID) 100 MG capsule Take 100 mg by mouth 2 (two) times daily. 05/20/21 05/27/21 Yes [provider]  polyethylene glycol powder (GLYCOLAX/MIRALAX) 17 GM/SCOOP powder as needed.    [provider]    Allergies  Dicyclomine hcl, Hyoscyamine, Other, Pine, Amoxicillin, and Penicillins  Review of Systems   Review of Systems  Constitutional:  Negative for chills and fever.  HENT:  Negative for sore throat.   Eyes:  Negative for redness.  Respiratory:  Negative for cough and shortness of breath.   Cardiovascular:  Negative for chest pain.  Gastrointestinal:  Positive for abdominal pain. Negative for vomiting.       Left flank  Genitourinary:  Positive for flank pain. Negative for dysuria.  Musculoskeletal:  Positive for back pain.       Left flank pain posteriorly.   Skin:  Negative for rash.  Neurological:  Negative for headaches.  Hematological:  Does not bruise/bleed easily.   Psychiatric/Behavioral:  Negative for confusion.    Physical Exam Updated Vital Signs BP 125/90 (BP Location: Left Arm)   Pulse 90   Temp 99.7 F (37.6 C) (Oral)   Resp 16   Ht 1.473 m (4\' 10" )   Wt 83.9 kg   LMP 04/29/2021   SpO2 99%   BMI 38.67 kg/m   Physical Exam Vitals and nursing note reviewed.  Constitutional:      Appearance: Normal appearance. She is well-developed.  HENT:     Head: Atraumatic.     Nose: Nose normal.     Mouth/Throat:     Mouth: Mucous membranes are moist.  Eyes:     General: No scleral icterus.    Conjunctiva/sclera: Conjunctivae normal.  Neck:     Trachea: No tracheal deviation.  Cardiovascular:     Rate and Rhythm: Normal rate and regular rhythm.     Pulses: Normal pulses.     Heart sounds: Normal heart sounds. No murmur heard.   No friction rub. No gallop.  Pulmonary:     Effort: Pulmonary effort is normal. No respiratory distress.     Breath sounds: Normal breath sounds.  Abdominal:     General: Bowel sounds are normal. There is no distension.     Palpations: Abdomen is soft.     Tenderness: There is abdominal tenderness. There is no guarding.     Comments: Left mid back muscular tenderness, ?mild left cva tenderness. T/L spine non tender, aligned.   Genitourinary:    Comments: ?mild left cva tenderness.  Musculoskeletal:        General: No swelling.     Cervical back: Normal range of motion and neck supple. No rigidity. No muscular tenderness.     Comments: T/L spine non tender, aligned. Left flank/left mid back muscular tenderness.   Skin:    General: Skin is warm and dry.     Findings: No rash.  Neurological:     Mental Status: She is alert.     Comments: Alert, speech normal.   Psychiatric:        Mood and Affect: Mood normal.    ED Results / Procedures / Treatments   Labs (all labs ordered are listed, but only abnormal results are displayed) Results for orders placed or performed during the hospital encounter of  05/25/21  CBC  Result Value Ref Range   WBC 6.0 4.0 - 10.5 K/uL   RBC 4.39 3.87 - 5.11 MIL/uL   Hemoglobin 13.1 12.0 - 15.0 g/dL   HCT 39.2 36.0 - 46.0 %   MCV 89.3 80.0 - 100.0 fL   MCH 29.8 26.0 - 34.0 pg   MCHC 33.4 30.0 - 36.0 g/dL   RDW 12.9 11.5 - 15.5 %   Platelets 249  150 - 400 K/uL   nRBC 0.0 0.0 - 0.2 %  Comprehensive metabolic panel  Result Value Ref Range   Sodium 140 135 - 145 mmol/L   Potassium 3.8 3.5 - 5.1 mmol/L   Chloride 108 98 - 111 mmol/L   CO2 24 22 - 32 mmol/L   Glucose, Bld 105 (H) 70 - 99 mg/dL   BUN 7 6 - 20 mg/dL   Creatinine, Ser 0.79 0.44 - 1.00 mg/dL   Calcium 9.4 8.9 - 10.3 mg/dL   Total Protein 6.9 6.5 - 8.1 g/dL   Albumin 4.5 3.5 - 5.0 g/dL   AST 13 (L) 15 - 41 U/L   ALT 13 0 - 44 U/L   Alkaline Phosphatase 57 38 - 126 U/L   Total Bilirubin 1.1 0.3 - 1.2 mg/dL   GFR, Estimated >60 >60 mL/min   Anion gap 8 5 - 15  Urinalysis, Routine w reflex microscopic Urine, Clean Catch  Result Value Ref Range   Color, Urine COLORLESS (A) YELLOW   APPearance CLEAR CLEAR   Specific Gravity, Urine <1.005 (L) 1.005 - 1.030   pH 6.0 5.0 - 8.0   Glucose, UA NEGATIVE NEGATIVE mg/dL   Hgb urine dipstick SMALL (A) NEGATIVE   Bilirubin Urine NEGATIVE NEGATIVE   Ketones, ur 15 (A) NEGATIVE mg/dL   Protein, ur NEGATIVE NEGATIVE mg/dL   Nitrite NEGATIVE NEGATIVE   Leukocytes,Ua SMALL (A) NEGATIVE   RBC / HPF 0-5 0 - 5 RBC/hpf   WBC, UA 0-5 0 - 5 WBC/hpf   Bacteria, UA RARE (A) NONE SEEN   Squamous Epithelial / LPF 6-10 0 - 5   Mucus PRESENT   Pregnancy, urine  Result Value Ref Range   Preg Test, Ur NEGATIVE NEGATIVE      EKG None  Radiology CT Renal Stone Study  Result Date: 05/25/2021 CLINICAL DATA:  Right flank pain EXAM: CT ABDOMEN AND PELVIS WITHOUT CONTRAST TECHNIQUE: Multidetector CT imaging of the abdomen and pelvis was performed following the standard protocol without IV contrast. COMPARISON:  03/09/2020 FINDINGS: Lower chest: No acute  abnormality. Hepatobiliary: No focal hepatic abnormality. Gallbladder unremarkable. Pancreas: No focal abnormality or ductal dilatation. Spleen: No focal abnormality.  Normal size. Adrenals/Urinary Tract: No adrenal abnormality. No focal renal abnormality. No stones or hydronephrosis. Urinary bladder is unremarkable. Stomach/Bowel: Stomach, large and small bowel grossly unremarkable. Normal appendix. Vascular/Lymphatic: No evidence of aneurysm or adenopathy. Reproductive: Uterus and adnexa unremarkable.  No mass. Other: No free fluid or free air. Musculoskeletal: No acute bony abnormality. IMPRESSION: No acute findings in the abdomen or pelvis. Electronically Signed   By: Rolm Baptise M.D.   On: 05/25/2021 15:31    Procedures Procedures   Medications Ordered in ED Medications - No data to display  ED Course  I have reviewed the triage vital signs and the nursing notes.  Pertinent labs & imaging results that were available during my care of the patient were reviewed by me and considered in my medical decision making (see chart for details).    MDM Rules/Calculators/A&P                          Iv ns bolus. Labs sent.   Reviewed nursing notes and prior charts for additional history.   Recent ua with LE pos, bl pos.   Labs reviewed/interpreted by me - ua nitrite neg, 0-5 wbc.   CT reviewed/interpreted by me - no ureteral stone or acute process.  Acetaminophen po.   As ua/ct neg, ? Whether left flank/back pain more musculoskeletal. Rx robaxin.   Rec pcp f/u.  Return precautions provided.   Final Clinical Impression(s) / ED Diagnoses Final diagnoses:  None    Rx / DC Orders ED Discharge Orders     None        Lajean Saver, MD 05/25/21 1544

## 2021-05-25 NOTE — ED Triage Notes (Signed)
Pt arrives to ED with c/o of right flank pain that started today. Pt was dx with UTI x5 days ago and started on antibiotics. Pt reports sporadic bladder pain. Pt denies dysuria and hematuria at this time.

## 2021-06-21 DIAGNOSIS — Z0000 Encounter for general adult medical examination without abnormal findings: Secondary | ICD-10-CM | POA: Diagnosis not present

## 2021-06-21 DIAGNOSIS — R5383 Other fatigue: Secondary | ICD-10-CM | POA: Diagnosis not present

## 2021-06-21 DIAGNOSIS — N6452 Nipple discharge: Secondary | ICD-10-CM | POA: Diagnosis not present

## 2021-06-21 DIAGNOSIS — F411 Generalized anxiety disorder: Secondary | ICD-10-CM | POA: Diagnosis not present

## 2021-06-21 DIAGNOSIS — K219 Gastro-esophageal reflux disease without esophagitis: Secondary | ICD-10-CM | POA: Diagnosis not present

## 2021-06-21 DIAGNOSIS — Z23 Encounter for immunization: Secondary | ICD-10-CM | POA: Diagnosis not present

## 2021-06-21 DIAGNOSIS — E559 Vitamin D deficiency, unspecified: Secondary | ICD-10-CM | POA: Diagnosis not present

## 2021-06-21 DIAGNOSIS — E785 Hyperlipidemia, unspecified: Secondary | ICD-10-CM | POA: Diagnosis not present

## 2021-06-25 ENCOUNTER — Other Ambulatory Visit: Payer: Self-pay | Admitting: Family Medicine

## 2021-06-25 DIAGNOSIS — N6452 Nipple discharge: Secondary | ICD-10-CM

## 2021-07-15 DIAGNOSIS — R947 Abnormal results of other endocrine function studies: Secondary | ICD-10-CM | POA: Diagnosis not present

## 2021-08-04 ENCOUNTER — Ambulatory Visit
Admission: RE | Admit: 2021-08-04 | Discharge: 2021-08-04 | Disposition: A | Discharge status: Home / Self Care | Payer: BC Managed Care – PPO | Source: Ambulatory Visit | Attending: Family Medicine | Admitting: Family Medicine

## 2021-08-04 ENCOUNTER — Other Ambulatory Visit: Payer: Self-pay | Admitting: Family Medicine

## 2021-08-04 ENCOUNTER — Other Ambulatory Visit: Payer: Self-pay

## 2021-08-04 DIAGNOSIS — N6452 Nipple discharge: Secondary | ICD-10-CM | POA: Diagnosis not present

## 2021-08-04 DIAGNOSIS — R922 Inconclusive mammogram: Secondary | ICD-10-CM | POA: Diagnosis not present

## 2021-08-13 ENCOUNTER — Ambulatory Visit
Admission: RE | Admit: 2021-08-13 | Discharge: 2021-08-13 | Disposition: A | Discharge status: Home / Self Care | Payer: BC Managed Care – PPO | Source: Ambulatory Visit | Attending: Family Medicine | Admitting: Family Medicine

## 2021-08-13 DIAGNOSIS — D241 Benign neoplasm of right breast: Secondary | ICD-10-CM | POA: Diagnosis not present

## 2021-08-13 DIAGNOSIS — N6313 Unspecified lump in the right breast, lower outer quadrant: Secondary | ICD-10-CM | POA: Diagnosis not present

## 2021-08-13 DIAGNOSIS — N6452 Nipple discharge: Secondary | ICD-10-CM

## 2021-08-14 ENCOUNTER — Emergency Department (HOSPITAL_BASED_OUTPATIENT_CLINIC_OR_DEPARTMENT_OTHER)
Admission: EM | Admit: 2021-08-14 | Discharge: 2021-08-14 | Disposition: A | Discharge status: Home / Self Care | Payer: BC Managed Care – PPO | Attending: Emergency Medicine | Admitting: Emergency Medicine

## 2021-08-14 ENCOUNTER — Encounter (HOSPITAL_BASED_OUTPATIENT_CLINIC_OR_DEPARTMENT_OTHER): Payer: Self-pay | Admitting: Radiology

## 2021-08-14 ENCOUNTER — Other Ambulatory Visit: Payer: Self-pay

## 2021-08-14 DIAGNOSIS — N898 Other specified noninflammatory disorders of vagina: Secondary | ICD-10-CM | POA: Diagnosis not present

## 2021-08-14 DIAGNOSIS — N949 Unspecified condition associated with female genital organs and menstrual cycle: Secondary | ICD-10-CM

## 2021-08-14 DIAGNOSIS — Z8616 Personal history of COVID-19: Secondary | ICD-10-CM | POA: Insufficient documentation

## 2021-08-14 DIAGNOSIS — N9089 Other specified noninflammatory disorders of vulva and perineum: Secondary | ICD-10-CM | POA: Diagnosis not present

## 2021-08-14 LAB — WET PREP, GENITAL
Clue Cells Wet Prep HPF POC: NONE SEEN
Sperm: NONE SEEN
Trich, Wet Prep: NONE SEEN
WBC, Wet Prep HPF POC: >=10 — AB (ref <10)
Yeast Wet Prep HPF POC: NONE SEEN

## 2021-08-14 MED ORDER — DOXYCYCLINE HYCLATE 100 MG PO CAPS: 100.0000 mg | ORAL_CAPSULE | Freq: Two times a day (BID) | ORAL | 0 refills | Status: AC

## 2021-08-14 MED ORDER — AZITHROMYCIN 250 MG PO TABS
1000.0000 mg | ORAL_TABLET | Freq: Once | ORAL | Status: AC
  Administered 2021-08-14: 1000 mg via ORAL
  Filled 2021-08-14: qty 4

## 2021-08-14 MED ORDER — GENTAMICIN SULFATE 40 MG/ML IJ SOLN
240.0000 mg | Freq: Once | INTRAMUSCULAR | Status: AC
  Administered 2021-08-14: 240 mg via INTRAMUSCULAR
  Filled 2021-08-14: qty 6

## 2021-08-14 NOTE — ED Notes (Signed)
Pelvic cart at bedside. 

## 2021-08-14 NOTE — ED Triage Notes (Signed)
Yesterday pt had a right breast biopsy which left a hematoma pt just wants to be sure that it is okay. Pt also has a small bump to her labia for the past few days with vaginal discharge that is white/yellow, with a foul odor. Pt believes she has a yeast infection.

## 2021-08-14 NOTE — ED Provider Notes (Signed)
[facility] EMERGENCY DEPT Provider Note   CSN: [id number] Arrival date & time: 08/14/21  1949     History Chief Complaint  Patient presents with   multiple complaints    Savannah Beck is a 30 y.o. female presenting today with her husband due to the complaint of vaginal discomfort and discharge.  She reports that for the past week she has been experiencing large amounts of discharge with an odor.  States that she is prone to BV however this does not feel the same.  Sexually active with her husband, endorsing monogamous relationship.  While trying to figure out why she was having pain, husband did an inspection and noted a small red lesion to the internal right labia.  She reports that she participates in oral sex with her husband who occasionally has cold sores.  Denies any new soaps, detergents, feminine health products.   She recently saw a breast surgeon due to a hematoma and had a biopsy performed.  They also told her she had elevated prolactin.  Endorsing breast pain near the area of biopsy.  Past Medical History:  Diagnosis Date   Allergic rhinitis    Bilateral ovarian cysts    Hyperlipidemia     Patient Active Problem List   Diagnosis Date Noted   Positive ANA (antinuclear antibody) 03/22/2021   Arthralgia 03/22/2021   Raynaud phenomenon 02/03/2021   History of COVID-19 01/26/2021   Physical deconditioning 01/26/2021   Memory loss 01/26/2021   Irregular heart rate 01/26/2021   OSA (obstructive sleep apnea) 01/26/2021   SOB (shortness of breath) 12/03/2020   Palpitations 12/03/2020   Morbid obesity ([place]) 12/03/2020   Mixed hyperlipidemia 12/03/2020   Patellar malalignment syndrome, right 01/31/2012   ANKLE PAIN, RIGHT 07/01/2010   OTHER ENTHESOPATHY OF ANKLE AND TARSUS 07/01/2010    Past Surgical History:  Procedure Laterality Date   ANGIOMA CAUTERY Right    LASER ABLATION OF VASCULAR LESION Left    x2     OB History   No obstetric history on  file.     Family History  Problem Relation Age of Onset   Thyroid disease Mother    Diabetes Mother    Cancer Father    Kidney cancer Father    Melanoma Father    Polycystic ovary syndrome Sister    Fibromyalgia Sister    Migraines Sister    Bipolar disorder Sister    Migraines Sister    Asthma Sister    Fibromyalgia Sister    Depression Brother    Anxiety disorder Brother    Anxiety disorder Brother    Depression Brother    Breast cancer Paternal Grandmother    Diabetes Other    CAD Other    Alzheimer's disease Other    Asthma Other    Hypertension Other     Social History   Tobacco Use   Smoking status: Never   Smokeless tobacco: Never  Vaping Use   Vaping Use: Never used  Substance Use Topics   Alcohol use: Yes    Comment: once or twice weekly   Drug use: No    Home Medications Prior to Admission medications   Medication Sig Start Date End Date Taking? Authorizing Provider  cetirizine (ZYRTEC) 10 MG tablet Take 10 mg by mouth daily.    [provider]  fluticasone (FLONASE) 50 MCG/ACT nasal spray Place 1-2 sprays into both nostrils daily as needed for allergies or rhinitis.     [provider]  hydrOXYzine (ATARAX/VISTARIL) 50 MG tablet Take 50 mg by mouth daily as needed for anxiety.    [provider]  methocarbamol (ROBAXIN) 500 MG tablet Take 1.5 tablets (750 mg total) by mouth 3 (three) times daily as needed (muscle spasm/pain). 05/25/21   Lajean Saver, MD  polyethylene glycol powder (GLYCOLAX/MIRALAX) 17 GM/SCOOP powder as needed.    [provider]    Allergies    Dicyclomine hcl, Hyoscyamine, Other, Pine, Amoxicillin, and Penicillins  Review of Systems   Review of Systems  Constitutional:  Negative for chills and fever.  Gastrointestinal:  Negative for diarrhea, nausea and vomiting.  Genitourinary:  Positive for genital sores, vaginal discharge and vaginal pain. Negative for hematuria and vaginal bleeding.   All other systems reviewed and are negative.  Physical Exam Updated Vital Signs BP (!) 147/95    Pulse 90    Temp 98.8 F (37.1 C)    Resp 16    Ht 4' 10"  (1.473 m)    Wt 83.9 kg    LMP 07/21/2021 (Exact Date)    SpO2 100%    BMI 38.67 kg/m   Physical Exam Vitals and nursing note reviewed. Exam conducted with a chaperone present.  Constitutional:      Appearance: Normal appearance.  HENT:     Head: Normocephalic and atraumatic.  Eyes:     General: No scleral icterus.    Conjunctiva/sclera: Conjunctivae normal.  Pulmonary:     Effort: Pulmonary effort is normal. No respiratory distress.  Genitourinary:    General: Normal vulva.     Labia:        Right: Tenderness and lesion present.        Left: No tenderness or lesion.      Vagina: Vaginal discharge present. No bleeding.     Cervix: Discharge present.       Comments: Small erythematous and tender ulcer to right lower internal labia, just external to vaginal introitus.  Copious amounts of yellow discharge in the vaginal vault.  Additional discharge from cervix with manipulation. Skin:    General: Skin is warm and dry.     Findings: No rash.  Neurological:     Mental Status: She is alert.  Psychiatric:        Mood and Affect: Mood normal.    ED Results / Procedures / Treatments   Labs (all labs ordered are listed, but only abnormal results are displayed) Labs Reviewed  WET PREP, GENITAL  HSV CULTURE AND TYPING  GC/CHLAMYDIA PROBE AMP ([place]) NOT AT [facility]    EKG None  Radiology MM CLIP PLACEMENT RIGHT  Result Date: 08/13/2021 CLINICAL DATA:  Status post right breast ultrasound-guided biopsy. EXAM: 3D DIAGNOSTIC RIGHT MAMMOGRAM POST ULTRASOUND BIOPSY COMPARISON:  Previous exam(s). FINDINGS: 3D Mammographic images were obtained following ultrasound guided biopsy of the right breast. The biopsy marking clip is in expected position at the site of biopsy. IMPRESSION: Appropriate positioning of the ribbon shaped  biopsy marking clip at the site of biopsy in the subareolar right breast. Final Assessment: Post Procedure Mammograms for Marker Placement Electronically Signed   By: Kristopher Oppenheim M.D.   On: 08/13/2021 08:45  [place] RT BREAST BX W LOC DEV 1ST LESION IMG BX SPEC US GUIDE  Result Date: 08/13/2021 CLINICAL DATA:  30 year old female with an indeterminate right breast intraductal mass in the setting of clear nipple discharge. EXAM: ULTRASOUND GUIDED RIGHT BREAST CORE NEEDLE BIOPSY COMPARISON:  Previous exam(s). PROCEDURE: I met with the patient and we  discussed the procedure of ultrasound-guided biopsy, including benefits and alternatives. We discussed the high likelihood of a successful procedure. We discussed the risks of the procedure, including infection, bleeding, tissue injury, clip migration, and inadequate sampling. Informed written consent was given. The usual time-out protocol was performed immediately prior to the procedure. Lesion quadrant: Lower outer quadrant Using sterile technique and 1% Lidocaine as local anesthetic, under direct ultrasound visualization, a 12 gauge spring-loaded device was used to perform biopsy of an intraductal mass in the retroareolar right breast using a lateral approach. At the conclusion of the procedure a ribbon shaped tissue marker clip was deployed into the biopsy cavity. Follow up 2 view mammogram was performed and dictated separately. IMPRESSION: Ultrasound guided biopsy of the right breast. No apparent complications. Electronically Signed   By: Kristopher Oppenheim M.D.   On: 08/13/2021 08:43   Procedures Procedures   Medications Ordered in ED Medications - No data to display  ED Course  I have reviewed the triage vital signs and the nursing notes.  Pertinent labs & imaging results that were available during my care of the patient were reviewed by me and considered in my medical decision making (see chart for details).    MDM Rules/Calculators/A&P 30 year old  female, presented with her husband today due to vaginal discharge and external vulvar lesion.  Physical exam revealed copious yellow discharge in the vault.  Also with an ulcerated and tender lesion to her internal labia on the right side.  She reported that she had a history of BV but this did not feel the same.  Wet prep nonrevealing for yeast or clue cells however with large amounts of white blood cells.  Vaginal discharge, discomfort, ulcerated lesion and symptoms suspicious for STI.  This was discussed with the patient without her husband.  When he was informed about the situation, he reported that around 10 years ago there was a possibility of him having a genital lesion however it disappeared so he is not concerned.  He has not noticed anything recently however is wondering if this could be why she is having the symptoms.  I discussed the importance of follow-up care with her GYN provider at this time.  She will be treated for chlamydia and gonorrhea.  She has been informed that if her chlamydia tests are negative she does not need to finish the doxycycline at the pharmacy.  Follow-up with her GYN as needed and she agrees with this plan.  Final Clinical Impression(s) / ED Diagnoses Final diagnoses:  Vaginal discharge  Genital lesion, female    Rx / DC Orders Results and diagnoses were explained to the patient. Return precautions discussed in full. Patient had no additional questions and expressed complete understanding.     Darliss Ridgel 08/14/21 2158    Blanchie Dessert, MD 08/15/21 [phone]

## 2021-08-14 NOTE — Discharge Instructions (Addendum)
You may utilize ibuprofen and Tylenol for your pain.  It is okay to take these together, or alternate them.  The antibiotics at the pharmacy will treat chlamydia.  You may take these, and if your test results come back negative then it is okay for you to stop the antibiotics.  The shot and pills you were given in the department cover you for gonorrhea in the event that you may need it.  Follow-up with your OB/GYN as soon as possible for follow-up care.

## 2021-08-16 LAB — GC/CHLAMYDIA PROBE AMP (~~LOC~~) NOT AT ARMC
Chlamydia: NEGATIVE
Comment: NEGATIVE
Comment: NORMAL
Neisseria Gonorrhea: NEGATIVE

## 2021-08-18 LAB — HSV CULTURE AND TYPING

## 2021-09-02 ENCOUNTER — Ambulatory Visit: Payer: Self-pay | Admitting: General Surgery

## 2021-09-02 DIAGNOSIS — D241 Benign neoplasm of right breast: Secondary | ICD-10-CM

## 2021-09-08 DIAGNOSIS — E221 Hyperprolactinemia: Secondary | ICD-10-CM | POA: Diagnosis not present

## 2021-09-08 DIAGNOSIS — Z01419 Encounter for gynecological examination (general) (routine) without abnormal findings: Secondary | ICD-10-CM | POA: Diagnosis not present

## 2021-09-08 DIAGNOSIS — Z6838 Body mass index (BMI) 38.0-38.9, adult: Secondary | ICD-10-CM | POA: Diagnosis not present

## 2022-01-27 DIAGNOSIS — R300 Dysuria: Secondary | ICD-10-CM | POA: Diagnosis not present

## 2022-01-27 DIAGNOSIS — Z113 Encounter for screening for infections with a predominantly sexual mode of transmission: Secondary | ICD-10-CM | POA: Diagnosis not present

## 2022-01-27 DIAGNOSIS — N760 Acute vaginitis: Secondary | ICD-10-CM | POA: Diagnosis not present

## 2022-04-07 IMAGING — US US  BREAST BX W/ LOC DEV 1ST LESION IMG BX SPEC US GUIDE*R*
1 series · 11 of 11 positions shown · non-contrast
Comparison: Previous exam(s).
COMPARISON: Previous exam(s).

Addendum:
CLINICAL DATA: 30-year-old female with an indeterminate right
breast intraductal mass in the setting of clear nipple discharge.

EXAM:
ULTRASOUND GUIDED RIGHT BREAST CORE NEEDLE BIOPSY

[Series 1: us breast bx w/ loc dev 1st lesion img bx spec us  · 0.05mm/px · 11 of 11 slices shown]
[im 1/11]
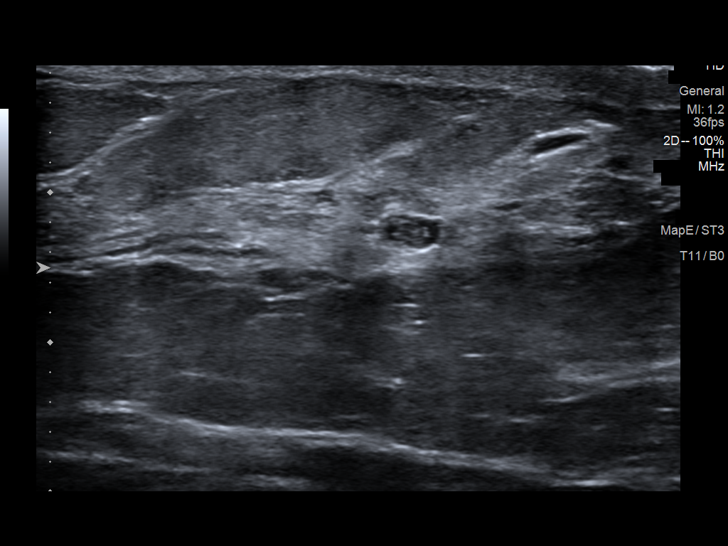
[im 2/11]
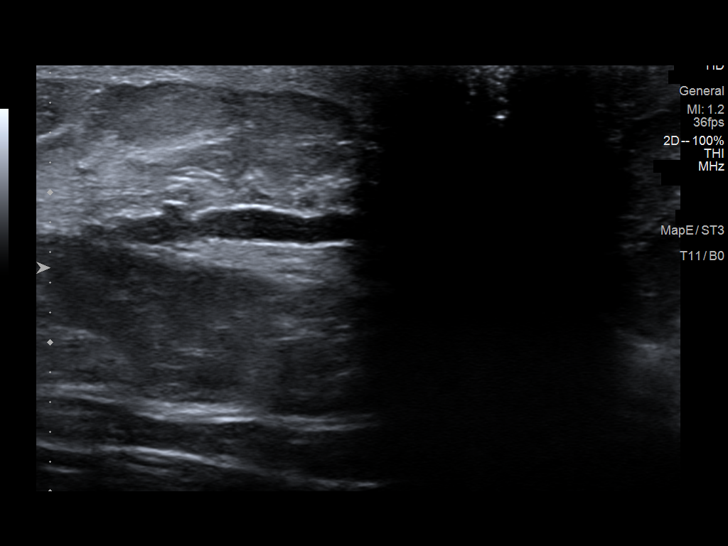
[im 3/11]
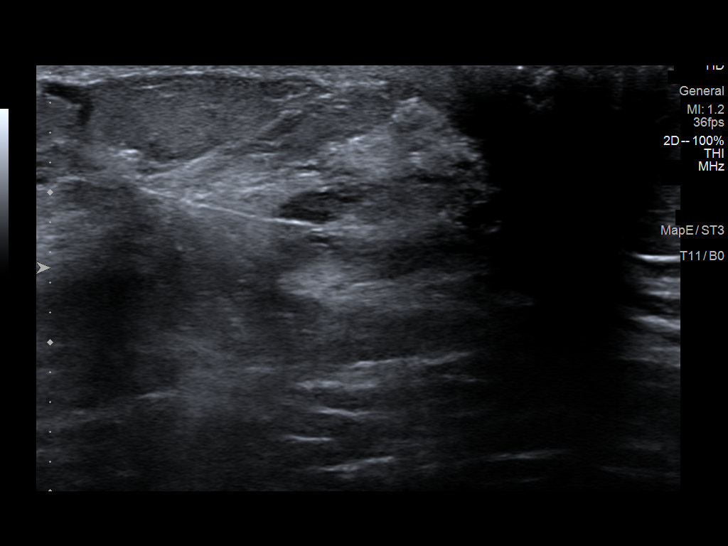
[im 4/11]
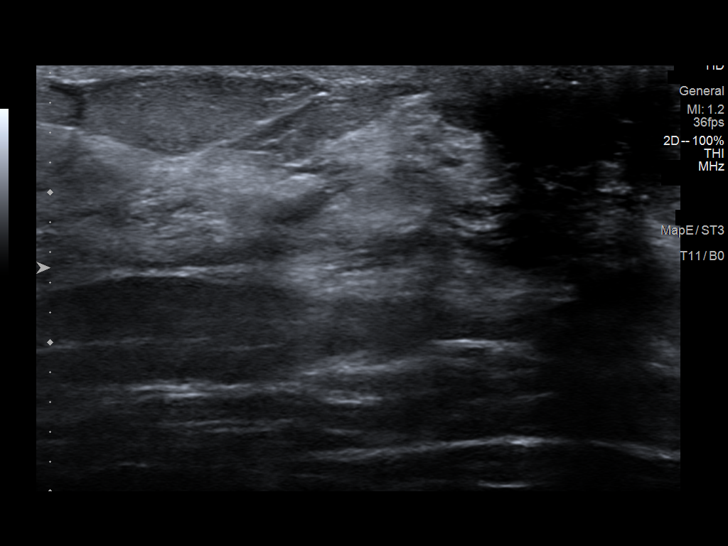
[im 5/11]
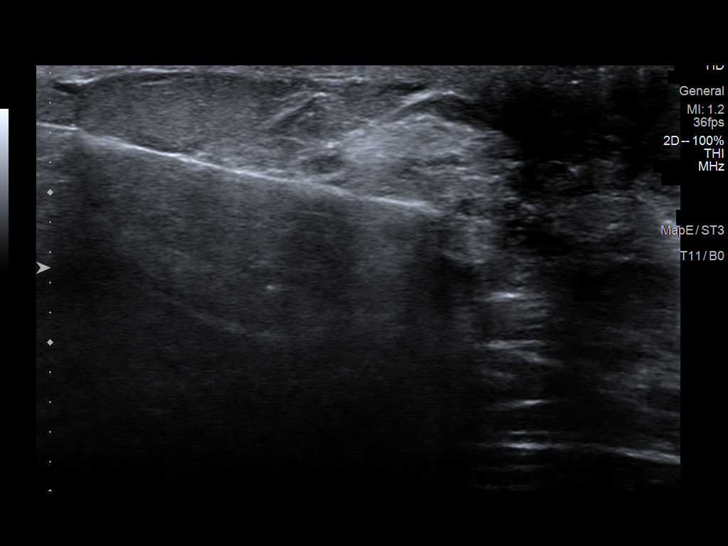
[im 6/11]
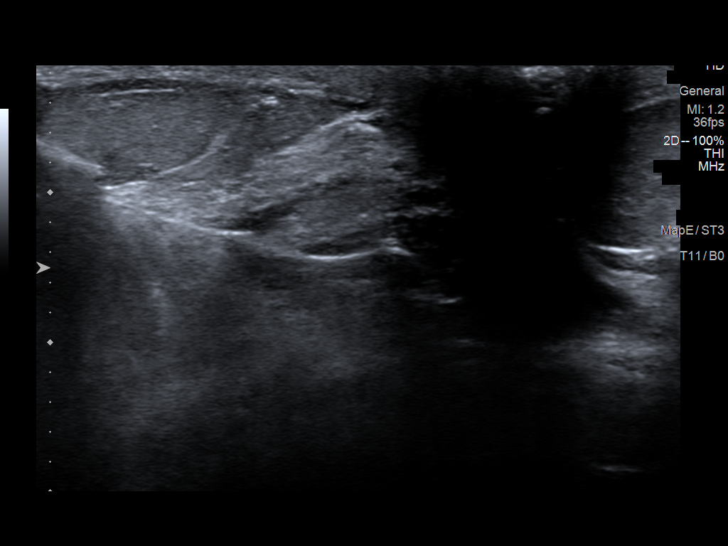
[im 7/11]
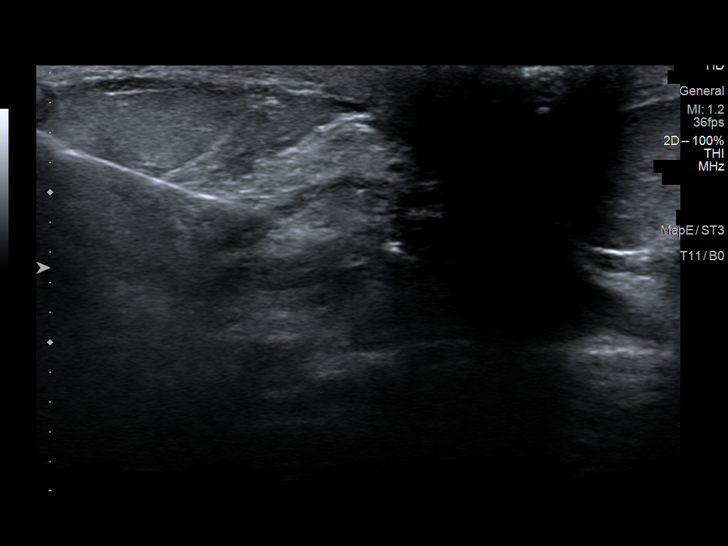
[im 8/11]
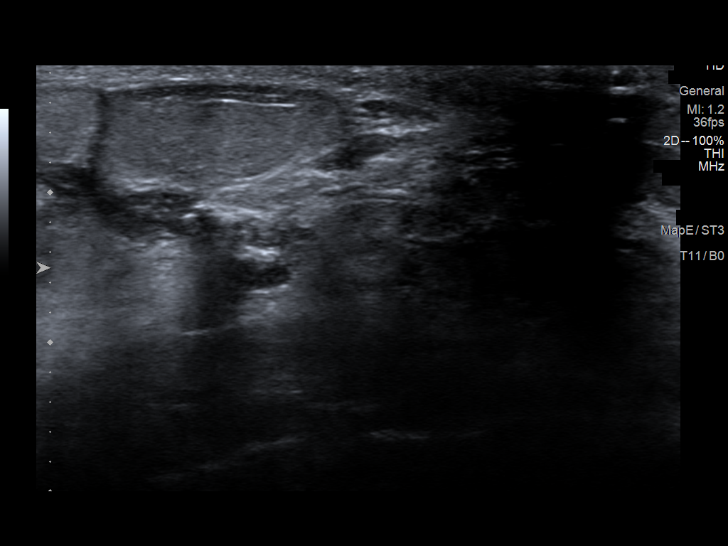
[im 9/11]
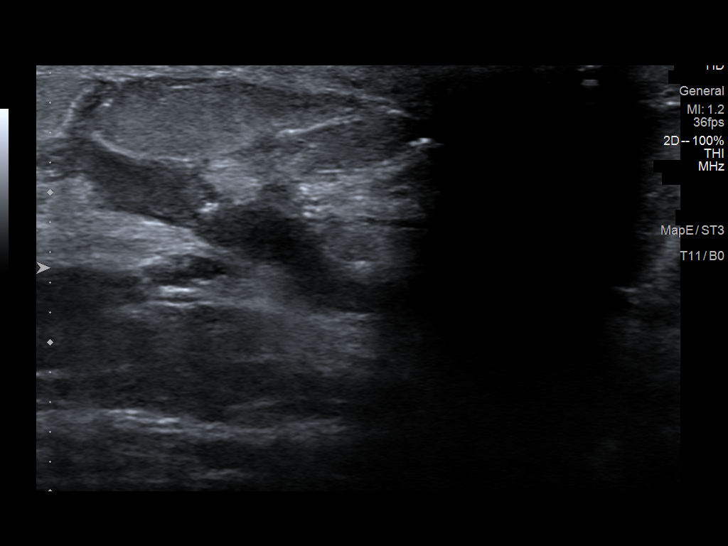
[im 10/11]
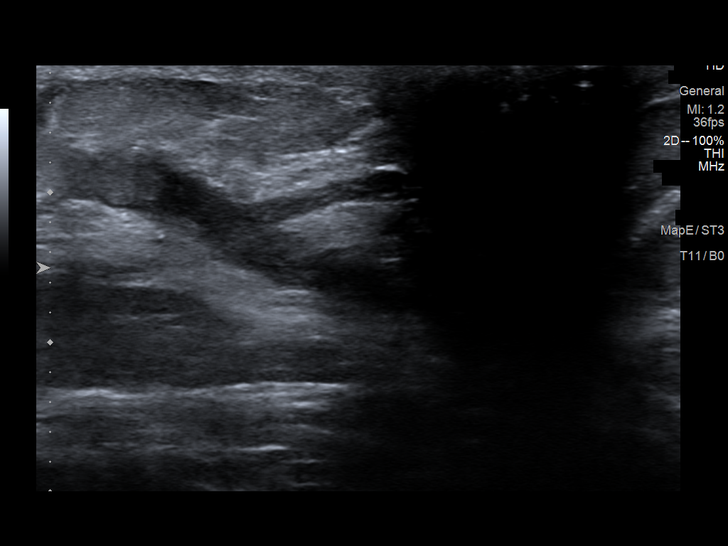
[im 11/11]
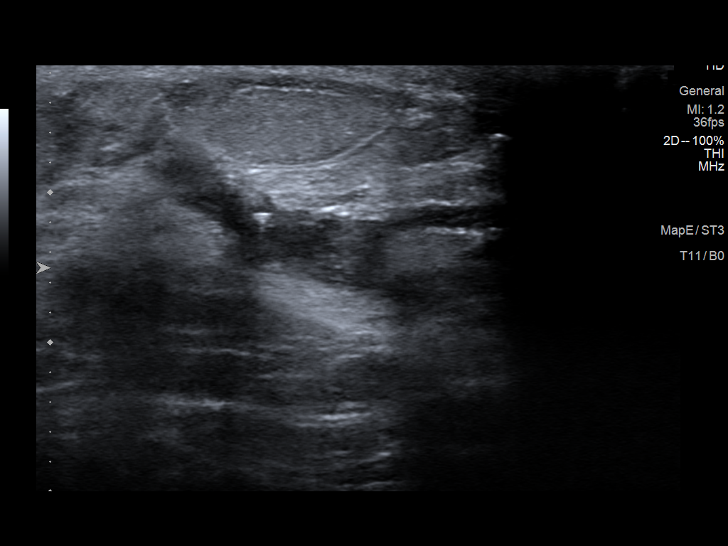

[11 of 11 positions shown; findings below may reference images not displayed]



Lesion quadrant: Lower outer quadrant

Using sterile technique and 1% Lidocaine as local anesthetic, under
direct ultrasound visualization, a 12 gauge Emerance device was
used to perform biopsy of an intraductal mass in the retroareolar
right breast using a lateral approach. At the conclusion of the
procedure a ribbon shaped tissue marker clip was deployed into the
biopsy cavity. Follow up 2 view mammogram was performed and dictated
separately.
IMPRESSION: Ultrasound guided biopsy of the right breast. No apparent
complications.

ADDENDUM:
Pathology revealed INTRADUCTAL PAPILLOMA- NO ATYPIA OR MALIGNANCY
IDENTIFIED of the RIGHT breast, 8 o'clock, retroareolar. This was
found to be concordant by Dr. Pozisa Tiger, with surgical
consultation for consideration of excision recommended.

Pathology results were discussed with the patient by telephone. The
patient reported doing well after the biopsy with tenderness at the
site. Post biopsy instructions and care were reviewed and questions
were answered. The patient was encouraged to call The [REDACTED]

Surgical consultation has been arranged with Dr. Jarina Eusuf at
[REDACTED] on September 02, 2021.

Pathology results reported by Irving Alejandro Tablas RN on 08/17/2021.



Lesion quadrant: Lower outer quadrant

Using sterile technique and 1% Lidocaine as local anesthetic, under
direct ultrasound visualization, a 12 gauge Emerance device was
used to perform biopsy of an intraductal mass in the retroareolar
right breast using a lateral approach. At the conclusion of the
procedure a ribbon shaped tissue marker clip was deployed into the
biopsy cavity. Follow up 2 view mammogram was performed and dictated
separately.
IMPRESSION: Ultrasound guided biopsy of the right breast. No apparent
complications.

## 2022-04-07 IMAGING — MG MM BREAST LOCALIZATION CLIP
4 series · 4 of 12 positions shown · non-contrast
Comparison: Previous exam(s).

CLINICAL DATA: Status post right breast ultrasound-guided biopsy.

EXAM:
3D DIAGNOSTIC RIGHT MAMMOGRAM POST ULTRASOUND BIOPSY

[R ML synth-2D]
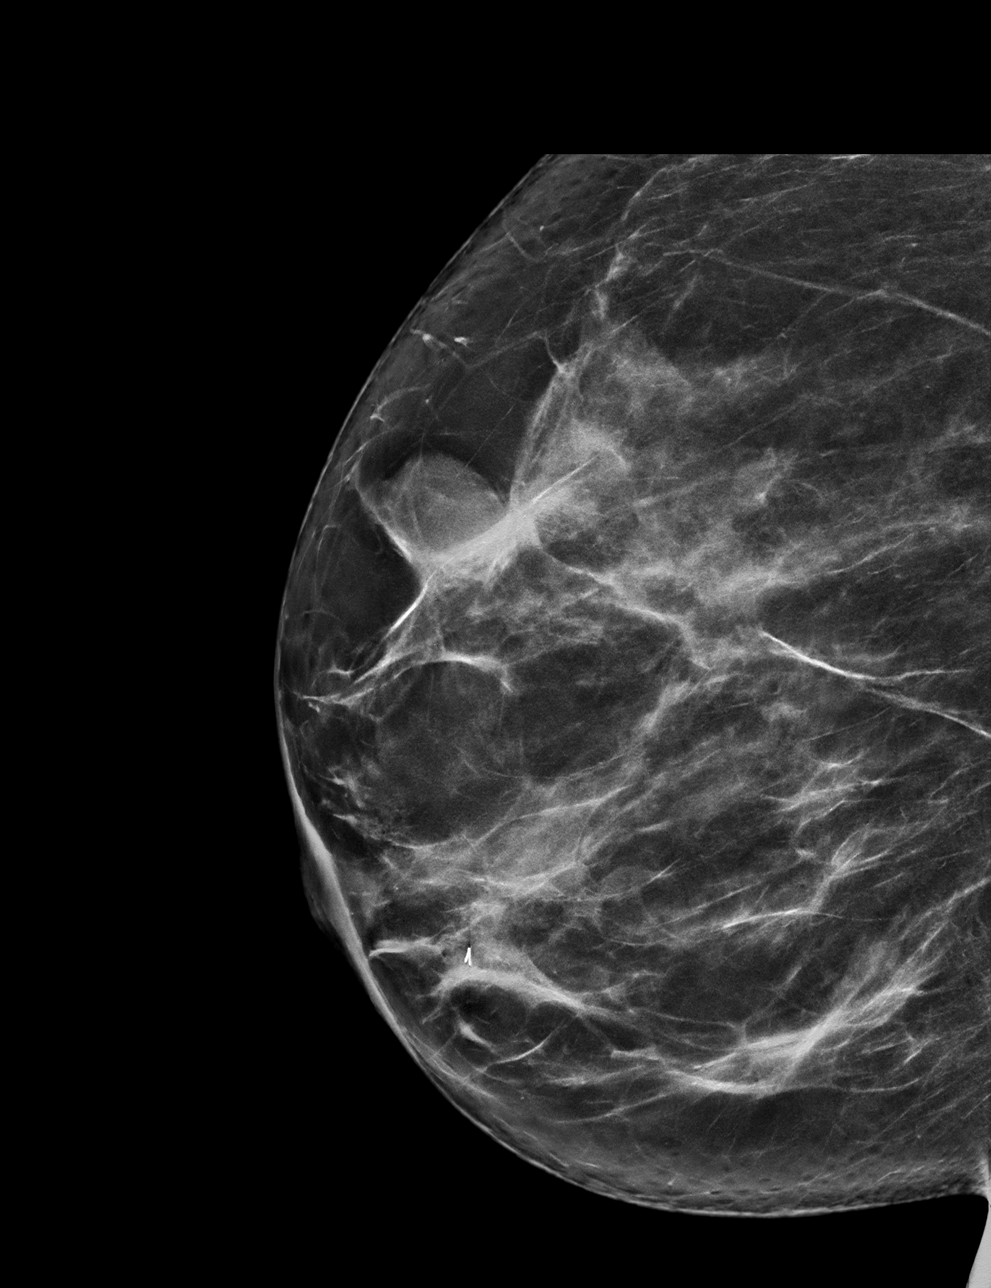

[R CC synth-2D]
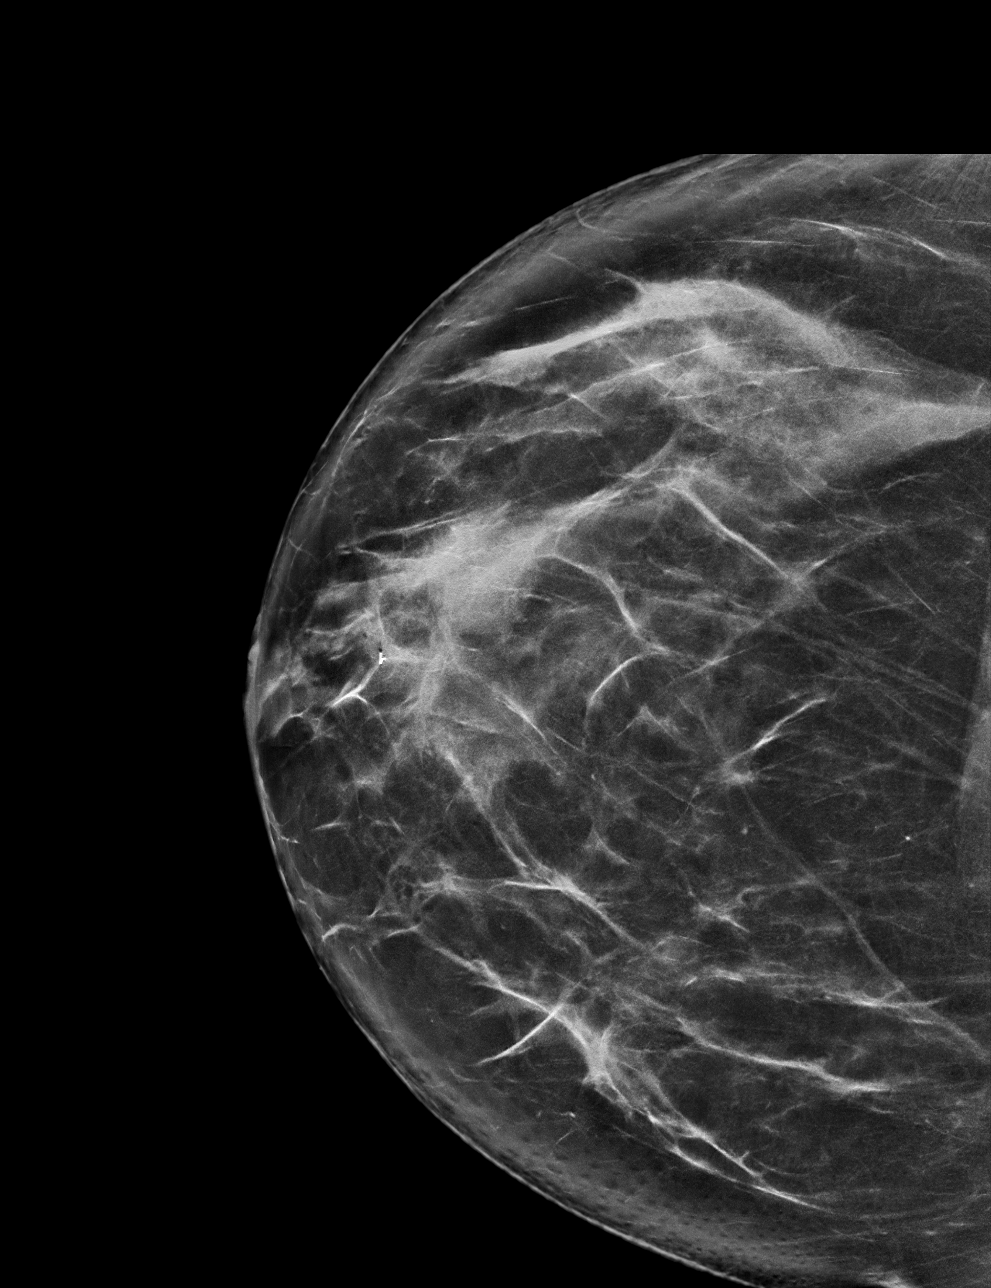

[R ML tomo · tomo slice 39/78.0]
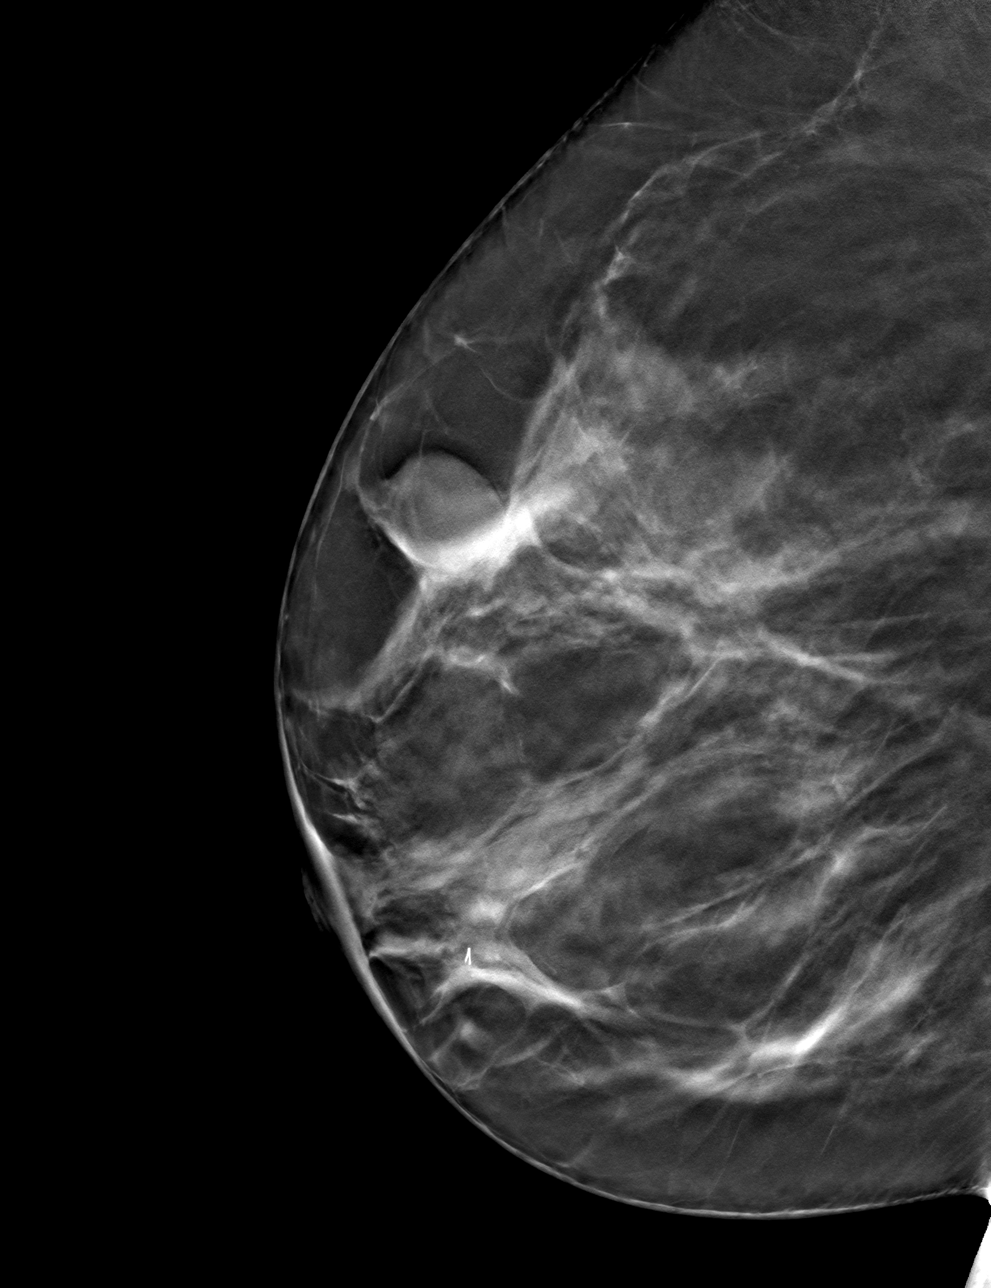

[R CC tomo · tomo slice 43/84.0]
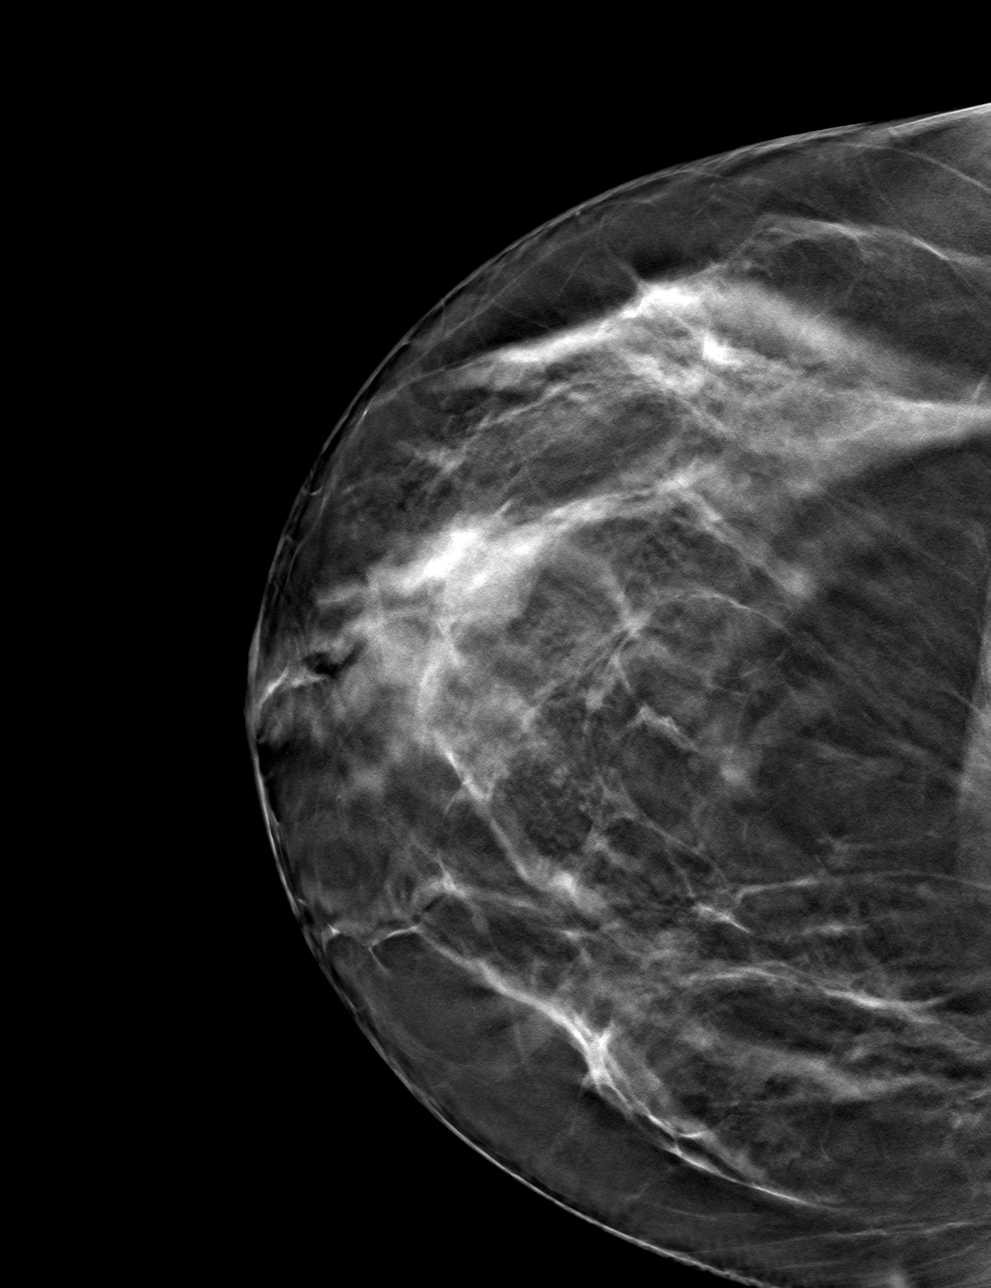

[4 of 12 positions shown; findings below may reference images not displayed]

FINDINGS: 3D Mammographic images were obtained following ultrasound guided
biopsy of the right breast. The biopsy marking clip is in expected
position at the site of biopsy.
IMPRESSION: Appropriate positioning of the ribbon shaped biopsy marking clip at
the site of biopsy in the subareolar right breast.

Final Assessment: Post Procedure Mammograms for Marker Placement

## 2022-04-27 DIAGNOSIS — N939 Abnormal uterine and vaginal bleeding, unspecified: Secondary | ICD-10-CM | POA: Diagnosis not present

## 2022-04-27 DIAGNOSIS — N926 Irregular menstruation, unspecified: Secondary | ICD-10-CM | POA: Diagnosis not present

## 2022-05-18 DIAGNOSIS — N939 Abnormal uterine and vaginal bleeding, unspecified: Secondary | ICD-10-CM | POA: Diagnosis not present

## 2022-06-07 DIAGNOSIS — D225 Melanocytic nevi of trunk: Secondary | ICD-10-CM | POA: Diagnosis not present

## 2022-06-07 DIAGNOSIS — D1801 Hemangioma of skin and subcutaneous tissue: Secondary | ICD-10-CM | POA: Diagnosis not present

## 2022-06-07 DIAGNOSIS — L858 Other specified epidermal thickening: Secondary | ICD-10-CM | POA: Diagnosis not present

## 2022-06-07 DIAGNOSIS — D2261 Melanocytic nevi of right upper limb, including shoulder: Secondary | ICD-10-CM | POA: Diagnosis not present

## 2022-08-02 DIAGNOSIS — R300 Dysuria: Secondary | ICD-10-CM | POA: Diagnosis not present

## 2022-08-08 ENCOUNTER — Encounter (HOSPITAL_BASED_OUTPATIENT_CLINIC_OR_DEPARTMENT_OTHER): Payer: Self-pay | Admitting: *Deleted

## 2022-08-08 ENCOUNTER — Emergency Department (HOSPITAL_BASED_OUTPATIENT_CLINIC_OR_DEPARTMENT_OTHER)
Admission: EM | Admit: 2022-08-08 | Discharge: 2022-08-08 | Disposition: A | Discharge status: Home / Self Care | Payer: Federal, State, Local not specified - PPO | Attending: Emergency Medicine | Admitting: Emergency Medicine

## 2022-08-08 ENCOUNTER — Other Ambulatory Visit: Payer: Self-pay

## 2022-08-08 DIAGNOSIS — N3090 Cystitis, unspecified without hematuria: Secondary | ICD-10-CM | POA: Diagnosis not present

## 2022-08-08 DIAGNOSIS — R319 Hematuria, unspecified: Secondary | ICD-10-CM | POA: Diagnosis not present

## 2022-08-08 DIAGNOSIS — N3001 Acute cystitis with hematuria: Secondary | ICD-10-CM | POA: Diagnosis not present

## 2022-08-08 DIAGNOSIS — D72829 Elevated white blood cell count, unspecified: Secondary | ICD-10-CM | POA: Insufficient documentation

## 2022-08-08 DIAGNOSIS — Z8616 Personal history of COVID-19: Secondary | ICD-10-CM | POA: Diagnosis not present

## 2022-08-08 DIAGNOSIS — R102 Pelvic and perineal pain: Secondary | ICD-10-CM | POA: Diagnosis not present

## 2022-08-08 DIAGNOSIS — R109 Unspecified abdominal pain: Secondary | ICD-10-CM

## 2022-08-08 DIAGNOSIS — N3091 Cystitis, unspecified with hematuria: Secondary | ICD-10-CM

## 2022-08-08 LAB — URINALYSIS, ROUTINE W REFLEX MICROSCOPIC
Bilirubin Urine: NEGATIVE
Bilirubin Urine: NEGATIVE
Glucose, UA: NEGATIVE mg/dL
Glucose, UA: NEGATIVE mg/dL
Ketones, ur: NEGATIVE mg/dL
Ketones, ur: NEGATIVE mg/dL
Nitrite: NEGATIVE
Nitrite: NEGATIVE
Protein, ur: 30 mg/dL — AB
Protein, ur: NEGATIVE mg/dL
RBC / HPF: >50 RBC/hpf — ABNORMAL HIGH (ref 0–5)
RBC / HPF: >50 RBC/hpf — ABNORMAL HIGH (ref 0–5)
Specific Gravity, Urine: 1.016 (ref 1.005–1.030)
Specific Gravity, Urine: 1.009 (ref 1.005–1.030)
WBC, UA: >50 WBC/hpf — ABNORMAL HIGH (ref 0–5)
WBC, UA: >50 WBC/hpf — ABNORMAL HIGH (ref 0–5)
pH: 5.5 (ref 5.0–8.0)
pH: 6 (ref 5.0–8.0)

## 2022-08-08 LAB — PREGNANCY, URINE: Preg Test, Ur: NEGATIVE

## 2022-08-08 MED ORDER — KETOROLAC TROMETHAMINE 15 MG/ML IJ SOLN
15.0000 mg | Freq: Once | INTRAMUSCULAR | Status: AC
  Administered 2022-08-08: 15 mg via INTRAVENOUS
  Filled 2022-08-08: qty 1

## 2022-08-08 MED ORDER — CEPHALEXIN 500 MG PO CAPS: 500.0000 mg | ORAL_CAPSULE | Freq: Three times a day (TID) | ORAL | 0 refills | Status: AC

## 2022-08-08 MED ORDER — SODIUM CHLORIDE 0.9 % IV BOLUS
1000.0000 mL | Freq: Once | INTRAVENOUS | Status: AC
  Administered 2022-08-08: 1000 mL via INTRAVENOUS

## 2022-08-08 MED ORDER — ONDANSETRON 4 MG PO TBDP: 4.0000 mg | ORAL_TABLET | Freq: Three times a day (TID) | ORAL | 0 refills | Status: AC | PRN

## 2022-08-08 NOTE — Discharge Instructions (Addendum)
Follow up with your PCP and urology.

## 2022-08-08 NOTE — ED Triage Notes (Signed)
Pt has had some urinary symptoms for a week and thought it was a UTI but this was ruled out by her PCP.  Pt is  having left flank pain since Wednesday which feels like a burning and she is having blood in her urine since 3am today.

## 2022-08-08 NOTE — ED Notes (Signed)
Pt went to bathroom to void

## 2022-08-08 NOTE — ED Provider Notes (Signed)
[facility] EMERGENCY DEPT Provider Note  CSN: [id number] Arrival date & time: 08/08/22 0534  Chief Complaint(s) Hematuria  HPI Savannah Beck is a 31 y.o. female with a past medical history listed below who presents to the emergency department with 1 week of frequency, urgency, dysuria and hematuria.  Patient states that she believes she had a urinary tract infection and had a OB/GYN appointment where they checked UA positive for hematuria.  Urine culture was sent which did not grow out any bacteria.  She presents today because she has persistent dysuria and hematuria and now has developed left flank pain described as a burning sensation. No rash. No associated nausea or vomiting.  No diarrhea.  No chest pain or shortness of breath.  The history is provided by the patient.    Past Medical History Past Medical History:  Diagnosis Date   Allergic rhinitis    Bilateral ovarian cysts    Hyperlipidemia    Patient Active Problem List   Diagnosis Date Noted   Positive ANA (antinuclear antibody) 03/22/2021   Arthralgia 03/22/2021   Raynaud phenomenon 02/03/2021   History of COVID-19 01/26/2021   Physical deconditioning 01/26/2021   Memory loss 01/26/2021   Irregular heart rate 01/26/2021   OSA (obstructive sleep apnea) 01/26/2021   SOB (shortness of breath) 12/03/2020   Palpitations 12/03/2020   Morbid obesity ([place]) 12/03/2020   Mixed hyperlipidemia 12/03/2020   Patellar malalignment syndrome, right 01/31/2012   ANKLE PAIN, RIGHT 07/01/2010   OTHER ENTHESOPATHY OF ANKLE AND TARSUS 07/01/2010   Home Medication(s) Prior to Admission medications   Medication Sig Start Date End Date Taking? Authorizing Provider  cephALEXin (KEFLEX) 500 MG capsule Take 1 capsule (500 mg total) by mouth 3 (three) times daily for 14 days. 08/08/22 08/22/22 Yes Jordyn Hofacker, Grayce Sessions, MD  ondansetron (ZOFRAN-ODT) 4 MG disintegrating tablet Take 1 tablet (4 mg total) by mouth every 8  (eight) hours as needed for up to 3 days for nausea or vomiting. 08/08/22 08/11/22 Yes Leodis Alcocer, Grayce Sessions, MD  cetirizine (ZYRTEC) 10 MG tablet Take 10 mg by mouth daily.    [provider]  doxycycline (VIBRAMYCIN) 100 MG capsule Take 1 capsule (100 mg total) by mouth 2 (two) times daily. 08/14/21   Redwine, Madison A, PA-C  fluticasone (FLONASE) 50 MCG/ACT nasal spray Place 1-2 sprays into both nostrils daily as needed for allergies or rhinitis.     [provider]  hydrOXYzine (ATARAX/VISTARIL) 50 MG tablet Take 50 mg by mouth daily as needed for anxiety.    [provider]  methocarbamol (ROBAXIN) 500 MG tablet Take 1.5 tablets (750 mg total) by mouth 3 (three) times daily as needed (muscle spasm/pain). 05/25/21   Lajean Saver, MD  polyethylene glycol powder (GLYCOLAX/MIRALAX) 17 GM/SCOOP powder as needed.    [provider]  Allergies Dicyclomine hcl, Hyoscyamine, Other, Pine, Amoxicillin, and Penicillins  Review of Systems Review of Systems As noted in HPI  Physical Exam Vital Signs  I have reviewed the triage vital signs BP 130/79   Pulse 68   Temp 98.9 F (37.2 C) (Oral)   Resp 17   Wt 86.2 kg   LMP 07/24/2022   SpO2 97%   BMI 39.71 kg/m   Physical Exam Vitals reviewed.  Constitutional:      General: She is not in acute distress.    Appearance: She is well-developed. She is obese. She is not diaphoretic.  HENT:     Head: Normocephalic and atraumatic.     Right Ear: External ear normal.     Left Ear: External ear normal.     Nose: Nose normal.  Eyes:     General: No scleral icterus.    Conjunctiva/sclera: Conjunctivae normal.  Neck:     Trachea: Phonation normal.  Cardiovascular:     Rate and Rhythm: Normal rate and regular rhythm.  Pulmonary:     Effort: Pulmonary effort is normal. No  respiratory distress.     Breath sounds: No stridor.  Abdominal:     General: There is no distension.  Musculoskeletal:        General: Normal range of motion.     Cervical back: Normal range of motion.  Skin:    Findings: No rash.          Comments: Severe pain with light touch to skin  Neurological:     Mental Status: She is alert and oriented to person, place, and time.  Psychiatric:        Behavior: Behavior normal.     ED Results and Treatments Labs (all labs ordered are listed, but only abnormal results are displayed) Labs Reviewed  URINALYSIS, ROUTINE W REFLEX MICROSCOPIC - Abnormal; Notable for the following components:      Result Value   APPearance CLOUDY (*)    Hgb urine dipstick LARGE (*)    Protein, ur 30 (*)    Leukocytes,Ua LARGE (*)    RBC / HPF >50 (*)    WBC, UA >50 (*)    All other components within normal limits  URINALYSIS, ROUTINE W REFLEX MICROSCOPIC - Abnormal; Notable for the following components:   APPearance HAZY (*)    Hgb urine dipstick LARGE (*)    Leukocytes,Ua LARGE (*)    RBC / HPF >50 (*)    WBC, UA >50 (*)    All other components within normal limits  URINE CULTURE  PREGNANCY, URINE                                                                                                                         EKG  EKG Interpretation  Date/Time:    Ventricular Rate:    PR Interval:    QRS Duration:   QT Interval:    QTC Calculation:   R Axis:  Text Interpretation:         Radiology No results found.  Medications Ordered in ED Medications  sodium chloride 0.9 % bolus 1,000 mL (1,000 mLs Intravenous New Bag/Given 08/08/22 0646)  ketorolac (TORADOL) 15 MG/ML injection 15 mg (15 mg Intravenous Given 08/08/22 0646)                                                                                                                                     Procedures Procedures  (including critical care time)  Medical Decision Making /  ED Course   Medical Decision Making Amount and/or Complexity of Data Reviewed Labs: ordered.  Risk Prescription drug management.    Hematuria Will obtain a UA to confirm hematuria and assess for possible infection. On review of records, patient has had numerous CT scans last of which was 1 year ago that were negative for renal stones.  Therefore I have low suspicion for renal colic at this time. -Initial UA was notable for hematuria, positive leukocytes RBCs and WBCs but also contaminated with epithelial cells. -I will have patient provide another urine sample for better analysis. -- repeat UA is concerning for UTI vs interstitial cystitis. Will treat for UTI/pyelo and sent culture to confirm.  Left flank pain Patient has severe allodynia with light touch of the soft tissues along the left flank and left abdomen in the region as noted above. Patient does have a history of varicella and shingles.  No rashes noted on exam.  Possible preherpetic neuralgia.  Additionally given the patient's other comorbidities fibromyalgia is also considered. The rest of the abdomen is benign to have low suspicion for serious intra-abdominal inflammatory/infectious processes. Will rule out pregnancy related process - UPT negative.       Final Clinical Impression(s) / ED Diagnoses Final diagnoses:  Hemorrhagic cystitis  Flank pain   The patient appears reasonably screened and/or stabilized for discharge and I doubt any other medical condition or other [facility] requiring further screening, evaluation, or treatment in the ED at this time. I have discussed the findings, Dx and Tx plan with the patient/family who expressed understanding and agree(s) with the plan. Discharge instructions discussed at length. The patient/family was given strict return precautions who verbalized understanding of the instructions. No further questions at time of discharge.  Disposition: Discharge  Condition: Good  ED Discharge  Orders          Ordered    cephALEXin (KEFLEX) 500 MG capsule  3 times daily        08/08/22 0729    ondansetron (ZOFRAN-ODT) 4 MG disintegrating tablet  Every 8 hours PRN        08/08/22 0729             Follow Up: [facility] [place] [phone] Call today discuss your visit, see when they want to see you in the  office  London Pepper, MD [address]  [address] [phone]  Call  in 3-5 days           This chart was dictated using voice recognition software.  Despite best efforts to proofread,  errors can occur which can change the documentation meaning.    Fatima Blank, MD 08/08/22 [phone]

## 2022-08-09 LAB — URINE CULTURE: Culture: <10000 — AB

## 2022-08-30 DIAGNOSIS — N390 Urinary tract infection, site not specified: Secondary | ICD-10-CM | POA: Diagnosis not present

## 2022-08-30 DIAGNOSIS — R3121 Asymptomatic microscopic hematuria: Secondary | ICD-10-CM | POA: Diagnosis not present

## 2022-08-30 DIAGNOSIS — R102 Pelvic and perineal pain: Secondary | ICD-10-CM | POA: Diagnosis not present

## 2022-08-30 DIAGNOSIS — B9620 Unspecified Escherichia coli [E. coli] as the cause of diseases classified elsewhere: Secondary | ICD-10-CM | POA: Diagnosis not present

## 2022-09-05 ENCOUNTER — Other Ambulatory Visit (HOSPITAL_COMMUNITY): Payer: Self-pay | Admitting: Urology

## 2022-09-05 DIAGNOSIS — N390 Urinary tract infection, site not specified: Secondary | ICD-10-CM

## 2022-09-13 ENCOUNTER — Ambulatory Visit (HOSPITAL_BASED_OUTPATIENT_CLINIC_OR_DEPARTMENT_OTHER)
Admission: RE | Admit: 2022-09-13 | Discharge: 2022-09-13 | Disposition: A | Discharge status: Home / Self Care | Payer: Federal, State, Local not specified - PPO | Source: Ambulatory Visit | Attending: Urology | Admitting: Urology

## 2022-09-13 DIAGNOSIS — N390 Urinary tract infection, site not specified: Secondary | ICD-10-CM | POA: Insufficient documentation

## 2022-09-14 DIAGNOSIS — R102 Pelvic and perineal pain: Secondary | ICD-10-CM | POA: Diagnosis not present

## 2022-09-14 DIAGNOSIS — R3121 Asymptomatic microscopic hematuria: Secondary | ICD-10-CM | POA: Diagnosis not present

## 2022-12-15 DIAGNOSIS — M2550 Pain in unspecified joint: Secondary | ICD-10-CM | POA: Diagnosis not present

## 2022-12-15 DIAGNOSIS — Z23 Encounter for immunization: Secondary | ICD-10-CM | POA: Diagnosis not present

## 2022-12-15 DIAGNOSIS — Z0000 Encounter for general adult medical examination without abnormal findings: Secondary | ICD-10-CM | POA: Diagnosis not present

## 2022-12-15 DIAGNOSIS — Z0184 Encounter for antibody response examination: Secondary | ICD-10-CM | POA: Diagnosis not present

## 2022-12-15 DIAGNOSIS — E559 Vitamin D deficiency, unspecified: Secondary | ICD-10-CM | POA: Diagnosis not present

## 2022-12-15 DIAGNOSIS — E785 Hyperlipidemia, unspecified: Secondary | ICD-10-CM | POA: Diagnosis not present

## 2022-12-15 DIAGNOSIS — R5383 Other fatigue: Secondary | ICD-10-CM | POA: Diagnosis not present

## 2022-12-15 DIAGNOSIS — M7989 Other specified soft tissue disorders: Secondary | ICD-10-CM | POA: Diagnosis not present

## 2022-12-15 DIAGNOSIS — R202 Paresthesia of skin: Secondary | ICD-10-CM | POA: Diagnosis not present

## 2022-12-16 DIAGNOSIS — R300 Dysuria: Secondary | ICD-10-CM | POA: Diagnosis not present

## 2023-01-02 ENCOUNTER — Other Ambulatory Visit: Payer: Self-pay | Admitting: Family

## 2023-01-02 DIAGNOSIS — N6320 Unspecified lump in the left breast, unspecified quadrant: Secondary | ICD-10-CM

## 2023-01-02 DIAGNOSIS — N6452 Nipple discharge: Secondary | ICD-10-CM

## 2023-01-10 NOTE — Progress Notes (Signed)
Office Visit Note  Patient: Savannah Beck             Date of Birth: 22-Apr-1991           MRN: [id number]             PCP: Farris Has, MD Referring: Farris Has, MD Visit Date: 01/11/2023   Subjective:  Follow-up (Patient states she has Raynaud's episodes. Patient states she has temperature irregulation. Patient states for all of 2022 and some of 2023 she would wake up and her body felt like it was on fire for about 15 minutes. Patient states the burning now only happens in her hands.)   History of Present Illness: Savannah Beck is a 32 y.o. female here for follow up for ongoing symptoms with raynaud's temperature fluctuations, fatigue, brain fog, joint pains. She had ANA test repeated with PCP office last month slightly higher positive result 1:160. Other serum markers ESR and CRP were normal.  She has been noticing a lot of ongoing symptoms some have worsened in others just varying in intensity over time.  Notices a lot of hot and cold intolerance.  She cannot tolerate hot shower without getting frequently dizzy or notices significant swelling or discoloration in her feet.  She still having Raynaud's symptoms involving her hands not associated with any residual skin lesions.  Still remains severely fatigued.  Apparently had indeterminate test for OSA in 2022.   Previous HPI 03/22/21 Savannah Beck is a 32 y.o. female here for positive ANA with symptoms of raynaud's and fatigue with family history of undifferentiated connective tissue disease.  Symptoms are pretty much all new since her infection with COVID in January of this year.  Before this year she had chronic venous insufficiency of the legs with chronic lymphedema in the left leg after ablation therapy.  This was pretty severe with about 3 weeks of symptoms including dyspnea and chest pains but did not require hospitalization.  After recovering from this she continues to feel some shortness of breath with exertion but no other  ongoing respiratory symptoms.  She does describe a period recently with about a week of sharp chest pain on deep inspiration but did not change positionally.  She has noticed what she describes as Raynaud's symptoms with hand discoloration with cold exposure with pallor extending proximal to the MCP joints symptoms usually resolving within about 5 or 10 minutes after starting to rewarm.  She also describes easy bruising throughout her extremities with very minor traumas.  She notices stiffness and swelling in the proximal hand joints with some erythema first thing in the morning.  Stiffness and swelling improved but also has joint tenderness continuing throughout most of the days.     Labs reviewed 01/2021 ANA 1:80 speckled RF neg CCP neg ESR 6   Review of Systems  Constitutional:  Positive for fatigue.  HENT:  Positive for mouth dryness. Negative for mouth sores.   Eyes:  Positive for dryness.  Respiratory:  Positive for shortness of breath.   Cardiovascular:  Positive for palpitations. Negative for chest pain.  Gastrointestinal:  Negative for blood in stool, constipation and diarrhea.  Endocrine: Positive for increased urination.  Genitourinary:  Positive for involuntary urination.  Musculoskeletal:  Positive for joint pain, joint pain, myalgias, muscle weakness, morning stiffness and myalgias. Negative for gait problem, joint swelling and muscle tenderness.  Skin:  Positive for sensitivity to sunlight. Negative for color change, rash and hair loss.  Allergic/Immunologic: Negative for susceptible  to infections.  Neurological:  Positive for dizziness and headaches.  Hematological:  Negative for swollen glands.  Psychiatric/Behavioral:  Positive for sleep disturbance. Negative for depressed mood. The patient is not nervous/anxious.     PMFS History:  Patient Active Problem List   Diagnosis Date Noted   Positive ANA (antinuclear antibody) 03/22/2021   Arthralgia 03/22/2021   Raynaud  phenomenon 02/03/2021   History of COVID-19 01/26/2021   Physical deconditioning 01/26/2021   Memory loss 01/26/2021   Irregular heart rate 01/26/2021   OSA (obstructive sleep apnea) 01/26/2021   SOB (shortness of breath) 12/03/2020   Palpitations 12/03/2020   Morbid obesity (HCC) 12/03/2020   Mixed hyperlipidemia 12/03/2020   Patellar malalignment syndrome, right 01/31/2012   ANKLE PAIN, RIGHT 07/01/2010   OTHER ENTHESOPATHY OF ANKLE AND TARSUS 07/01/2010    Past Medical History:  Diagnosis Date   Allergic rhinitis    Bilateral ovarian cysts    Hyperlipidemia    Long COVID    Raynaud's disease    post-Covid    Family History  Problem Relation Age of Onset   Thyroid disease Mother    Diabetes Mother    Hypertension Mother    Cancer Father        Blood   Kidney cancer Father    Melanoma Father    Hypertension Father    Polycystic ovary syndrome Sister    Fibromyalgia Sister    Migraines Sister    Bipolar disorder Sister    Migraines Sister    Asthma Sister    Fibromyalgia Sister    Breast cancer Paternal Grandmother        unknown   Breast cancer Cousin        45s   Depression Brother    Anxiety disorder Brother    Anxiety disorder Brother    Depression Brother    Autoimmune disease Brother    Diabetes Other    CAD Other    Alzheimer's disease Other    Asthma Other    Hypertension Other    Past Surgical History:  Procedure Laterality Date   ABLATION SAPHENOUS VEIN W/ RFA     twice in left leg   ANGIOMA CAUTERY Right    BREAST BIOPSY     LASER ABLATION OF VASCULAR LESION Left    x2   Social History   Social History Narrative   Not on file   Immunization History  Administered Date(s) Administered   Hepatitis B 03/30/2003   Influenza,inj,Quad PF,6+ Mos 05/09/2017, 06/04/2018, 07/04/2019   MMR 12/28/1998   PFIZER(Purple Top)SARS-COV-2 Vaccination 11/14/2019, 12/05/2019   Tdap 11/28/2011     Objective: Vital Signs: BP 130/83 (BP Location: Right  Arm, Patient Position: Sitting, Cuff Size: Normal)   Pulse 88   Resp 14   Ht 4' 9.5" (1.461 m)   Wt 188 lb (85.3 kg)   LMP 01/11/2023   BMI 39.98 kg/m    Physical Exam Constitutional:      Appearance: She is obese.  Eyes:     Conjunctiva/sclera: Conjunctivae normal.  Cardiovascular:     Rate and Rhythm: Normal rate and regular rhythm.  Pulmonary:     Effort: Pulmonary effort is normal.     Breath sounds: Normal breath sounds.  Lymphadenopathy:     Cervical: No cervical adenopathy.  Skin:    General: Skin is warm and dry.     Comments: No digital pitting  Neurological:     Mental Status: She is alert.  Psychiatric:  Mood and Affect: Mood normal.      Musculoskeletal Exam:  Shoulders full ROM no tenderness or swelling Elbows full ROM no tenderness or swelling Wrists full ROM no tenderness or swelling Fingers full ROM tenderness to pressure over MCP joints without palpable synovitis Knees full ROM mild tenderness to medial joint line palpation, no swelling   Investigation: No additional findings.  Imaging: MM 3D DIAGNOSTIC MAMMOGRAM BILATERAL BREAST  Result Date: 01/17/2023 CLINICAL DATA:  Patient presents for evaluation of spontaneous bilateral clear nipple discharge and palpable mass left breast. Patient had biopsy of the right breast 08/13/2021 demonstrating intraductal papilloma. Patient did not have surgical excision of the papilloma. Patient has had clear spontaneous nipple discharge from the right breast since the original evaluation 08/04/2021, with a brief time after the biopsy with no discharge, and has had left clear spontaneous nipple discharge for approximately 2 months. Patient additionally reports new palpable areas of concern within the left breast. EXAM: DIGITAL DIAGNOSTIC BILATERAL MAMMOGRAM WITH TOMOSYNTHESIS; ULTRASOUND RIGHT BREAST LIMITED; ULTRASOUND LEFT BREAST LIMITED TECHNIQUE: Bilateral digital diagnostic mammography and breast tomosynthesis  was performed.; Targeted ultrasound examination of the right breast was performed; Targeted ultrasound examination of the left breast was performed. COMPARISON:  Previous exam(s). ACR Breast Density Category c: The breasts are heterogeneously dense, which may obscure small masses. FINDINGS: Ribbon shaped marking clip in the retroareolar right breast at the site of previously biopsied right breast papilloma. Stable oval mass within the upper-outer right breast compatible with previously followed fibroadenoma. No additional masses, calcifications or distortion identified within either breast. On physical exam, dense tissue is palpated upper-outer left breast. Targeted ultrasound is performed, showing no retroareolar right breast mass or dilated duct. The previously biopsy retroareolar right breast papilloma is not visualized on ultrasound on today's exam. Within the right breast 11 o'clock position 4 cm from nipple there is a stable 2.0 x 1.0 x 1.7 cm oval hypoechoic mass most compatible with benign fibroadenoma given stability over time. No dilated duct or intraductal mass within the retroareolar left breast. No suspicious abnormality within the left breast 6 o'clock position at the site of palpable concern. IMPRESSION: 1. Bilateral clear nipple discharge, indeterminate. Patient has history of right retroareolar papilloma which has not been surgically excised however was not definitely able to be visualized on ultrasound during today's examination. No retroareolar left breast mass or retroareolar right breast mass visualized today. 2. Benign right breast mass 11 o'clock position compatible with fibroadenoma given stability over time. 3. No suspicious abnormality at the site of palpable concern left breast. RECOMMENDATION: 1. Bilateral breast MRI and surgical consultation for history of spontaneous clear nipple discharge. I have discussed the findings and recommendations with the patient. If applicable, a reminder  letter will be sent to the patient regarding the next appointment. BI-RADS CATEGORY  2: Benign. Electronically Signed   By: Annia Belt M.D.   On: 01/17/2023 09:48  [place] LIMITED ULTRASOUND INCLUDING AXILLA LEFT BREAST   Result Date: 01/17/2023 CLINICAL DATA:  Patient presents for evaluation of spontaneous bilateral clear nipple discharge and palpable mass left breast. Patient had biopsy of the right breast 08/13/2021 demonstrating intraductal papilloma. Patient did not have surgical excision of the papilloma. Patient has had clear spontaneous nipple discharge from the right breast since the original evaluation 08/04/2021, with a brief time after the biopsy with no discharge, and has had left clear spontaneous nipple discharge for approximately 2 months. Patient additionally reports new palpable areas of concern within the left breast.  EXAM: DIGITAL DIAGNOSTIC BILATERAL MAMMOGRAM WITH TOMOSYNTHESIS; ULTRASOUND RIGHT BREAST LIMITED; ULTRASOUND LEFT BREAST LIMITED TECHNIQUE: Bilateral digital diagnostic mammography and breast tomosynthesis was performed.; Targeted ultrasound examination of the right breast was performed; Targeted ultrasound examination of the left breast was performed. COMPARISON:  Previous exam(s). ACR Breast Density Category c: The breasts are heterogeneously dense, which may obscure small masses. FINDINGS: Ribbon shaped marking clip in the retroareolar right breast at the site of previously biopsied right breast papilloma. Stable oval mass within the upper-outer right breast compatible with previously followed fibroadenoma. No additional masses, calcifications or distortion identified within either breast. On physical exam, dense tissue is palpated upper-outer left breast. Targeted ultrasound is performed, showing no retroareolar right breast mass or dilated duct. The previously biopsy retroareolar right breast papilloma is not visualized on ultrasound on today's exam. Within the right breast 11  o'clock position 4 cm from nipple there is a stable 2.0 x 1.0 x 1.7 cm oval hypoechoic mass most compatible with benign fibroadenoma given stability over time. No dilated duct or intraductal mass within the retroareolar left breast. No suspicious abnormality within the left breast 6 o'clock position at the site of palpable concern. IMPRESSION: 1. Bilateral clear nipple discharge, indeterminate. Patient has history of right retroareolar papilloma which has not been surgically excised however was not definitely able to be visualized on ultrasound during today's examination. No retroareolar left breast mass or retroareolar right breast mass visualized today. 2. Benign right breast mass 11 o'clock position compatible with fibroadenoma given stability over time. 3. No suspicious abnormality at the site of palpable concern left breast. RECOMMENDATION: 1. Bilateral breast MRI and surgical consultation for history of spontaneous clear nipple discharge. I have discussed the findings and recommendations with the patient. If applicable, a reminder letter will be sent to the patient regarding the next appointment. BI-RADS CATEGORY  2: Benign. Electronically Signed   By: Annia Belt M.D.   On: 01/17/2023 09:48  [place] LIMITED ULTRASOUND INCLUDING AXILLA RIGHT BREAST  Result Date: 01/17/2023 CLINICAL DATA:  Patient presents for evaluation of spontaneous bilateral clear nipple discharge and palpable mass left breast. Patient had biopsy of the right breast 08/13/2021 demonstrating intraductal papilloma. Patient did not have surgical excision of the papilloma. Patient has had clear spontaneous nipple discharge from the right breast since the original evaluation 08/04/2021, with a brief time after the biopsy with no discharge, and has had left clear spontaneous nipple discharge for approximately 2 months. Patient additionally reports new palpable areas of concern within the left breast. EXAM: DIGITAL DIAGNOSTIC BILATERAL MAMMOGRAM  WITH TOMOSYNTHESIS; ULTRASOUND RIGHT BREAST LIMITED; ULTRASOUND LEFT BREAST LIMITED TECHNIQUE: Bilateral digital diagnostic mammography and breast tomosynthesis was performed.; Targeted ultrasound examination of the right breast was performed; Targeted ultrasound examination of the left breast was performed. COMPARISON:  Previous exam(s). ACR Breast Density Category c: The breasts are heterogeneously dense, which may obscure small masses. FINDINGS: Ribbon shaped marking clip in the retroareolar right breast at the site of previously biopsied right breast papilloma. Stable oval mass within the upper-outer right breast compatible with previously followed fibroadenoma. No additional masses, calcifications or distortion identified within either breast. On physical exam, dense tissue is palpated upper-outer left breast. Targeted ultrasound is performed, showing no retroareolar right breast mass or dilated duct. The previously biopsy retroareolar right breast papilloma is not visualized on ultrasound on today's exam. Within the right breast 11 o'clock position 4 cm from nipple there is a stable 2.0 x 1.0 x 1.7 cm oval hypoechoic  mass most compatible with benign fibroadenoma given stability over time. No dilated duct or intraductal mass within the retroareolar left breast. No suspicious abnormality within the left breast 6 o'clock position at the site of palpable concern. IMPRESSION: 1. Bilateral clear nipple discharge, indeterminate. Patient has history of right retroareolar papilloma which has not been surgically excised however was not definitely able to be visualized on ultrasound during today's examination. No retroareolar left breast mass or retroareolar right breast mass visualized today. 2. Benign right breast mass 11 o'clock position compatible with fibroadenoma given stability over time. 3. No suspicious abnormality at the site of palpable concern left breast. RECOMMENDATION: 1. Bilateral breast MRI and surgical  consultation for history of spontaneous clear nipple discharge. I have discussed the findings and recommendations with the patient. If applicable, a reminder letter will be sent to the patient regarding the next appointment. BI-RADS CATEGORY  2: Benign. Electronically Signed   By: Annia Belt M.D.   On: 01/17/2023 09:48   Recent Labs: Lab Results  Component Value Date   WBC 6.0 05/25/2021   HGB 13.1 05/25/2021   PLT 249 05/25/2021   NA 140 05/25/2021   K 3.8 05/25/2021   CL 108 05/25/2021   CO2 24 05/25/2021   GLUCOSE 105 (H) 05/25/2021   BUN 7 05/25/2021   CREATININE 0.79 05/25/2021   BILITOT 1.1 05/25/2021   ALKPHOS 57 05/25/2021   AST 13 (L) 05/25/2021   ALT 13 05/25/2021   PROT 6.9 05/25/2021   ALBUMIN 4.5 05/25/2021   CALCIUM 9.4 05/25/2021   GFRAA >60 03/09/2020    Speciality Comments: No specialty comments available.  Procedures:  No procedures performed Allergies: Dicyclomine hcl, Hyoscyamine, Other, Pine, Amoxicillin, and Penicillins   Assessment / Plan:     Visit Diagnoses: Positive ANA (antinuclear antibody) - Plan: RNP Antibody, Anti-Smith antibody, Sjogrens syndrome-A extractable nuclear antibody, Anti-DNA antibody, double-stranded, C3 and C4, Chromatin (Nucleosomal) Antibody  Still has numerous persistent symptoms though lacking specific clinical criteria for lupus or a particular inflammatory syndrome.  Will recheck serum antibody markers as detailed above and complements for any findings associated with the positive ANA.  If trending significantly upward or more specific marker identified could consider addition of hydroxychloroquine as a potential treatment.  Raynaud's phenomenon without gangrene Palpitations  Heart palpitations, shower test intolerance, Raynaud's has multiple symptoms suggestive for may be some type of autonomic dysregulation. Has an appointment in July for [facility] also evaluate for possible neuropathy issue.   OSA  (obstructive sleep apnea)  With significant fatigue and sleep disruption I think she benefit from having another evaluation.   Already has [facility] appointment for separate issue, can also discuss about sleep medicine evaluation.  Arthralgia, unspecified joint - Plan: DULoxetine (CYMBALTA) 30 MG capsule  Widespread body aches without obvious synovitis along with the numerous other symptoms suggestive for fibromyalgia.  If lab workup as above unremarkable would recommend starting medicine targeting for neuropathic pain, first choice would be low-dose duloxetine.  Orders: Orders Placed This Encounter  Procedures   RNP Antibody   Anti-Smith antibody   Sjogrens syndrome-A extractable nuclear antibody   Anti-DNA antibody, double-stranded   C3 and C4   Chromatin (Nucleosomal) Antibody   Meds ordered this encounter  Medications   DULoxetine (CYMBALTA) 30 MG capsule    Sig: Take 1 capsule (30 mg total) by mouth daily.    Dispense:  30 capsule    Refill:  3     Follow-Up Instructions: No follow-ups on  file.   Fuller Plan, MD  Note - This record has been created using Dragon software.  Chart creation errors have been sought, but may not always  have been located. Such creation errors do not reflect on  the standard of medical care.

## 2023-01-11 ENCOUNTER — Encounter: Payer: Self-pay | Admitting: Internal Medicine

## 2023-01-11 ENCOUNTER — Ambulatory Visit: Payer: Federal, State, Local not specified - PPO | Attending: Internal Medicine | Admitting: Internal Medicine

## 2023-01-11 VITALS — BP 130/83 | HR 88 | Resp 14 | Ht <= 58 in | Wt 188.0 lb

## 2023-01-11 DIAGNOSIS — R002 Palpitations: Secondary | ICD-10-CM | POA: Diagnosis not present

## 2023-01-11 DIAGNOSIS — R7689 Other specified abnormal immunological findings in serum: Secondary | ICD-10-CM

## 2023-01-11 DIAGNOSIS — G4733 Obstructive sleep apnea (adult) (pediatric): Secondary | ICD-10-CM | POA: Diagnosis not present

## 2023-01-11 DIAGNOSIS — R768 Other specified abnormal immunological findings in serum: Secondary | ICD-10-CM | POA: Diagnosis not present

## 2023-01-11 DIAGNOSIS — I7300 Raynaud's syndrome without gangrene: Secondary | ICD-10-CM | POA: Diagnosis not present

## 2023-01-11 DIAGNOSIS — M2550 Pain in unspecified joint: Secondary | ICD-10-CM

## 2023-01-12 DIAGNOSIS — R102 Pelvic and perineal pain: Secondary | ICD-10-CM | POA: Diagnosis not present

## 2023-01-12 DIAGNOSIS — M62838 Other muscle spasm: Secondary | ICD-10-CM | POA: Diagnosis not present

## 2023-01-12 LAB — C3 AND C4
C3 Complement: 136 mg/dL (ref 83–193)
C4 Complement: 25 mg/dL (ref 15–57)

## 2023-01-12 LAB — SJOGRENS SYNDROME-A EXTRACTABLE NUCLEAR ANTIBODY: SSA (Ro) (ENA) Antibody, IgG: <1 AI

## 2023-01-12 LAB — ANTI-SMITH ANTIBODY: ENA SM Ab Ser-aCnc: <1 AI

## 2023-01-12 LAB — CHROMATIN (NUCLEOSOMAL) ANTIBODY: Chromatin (Nucleosomal) Antibody: <1 AI

## 2023-01-12 LAB — RNP ANTIBODY: Ribonucleic Protein(ENA) Antibody, IgG: <1 AI

## 2023-01-12 LAB — ANTI-DNA ANTIBODY, DOUBLE-STRANDED: ds DNA Ab: <1 IU/mL

## 2023-01-13 MED ORDER — DULOXETINE HCL 30 MG PO CPEP: 30.0000 mg | ORAL_CAPSULE | Freq: Every day | ORAL | 3 refills | Status: AC

## 2023-01-13 NOTE — Progress Notes (Signed)
Antibody testing is again all entirely negative.  I recommend starting a trial of the Cymbalta medication as we discussed for possible neuropathy or fibromyalgia type symptoms.  If tolerated I usually recommend giving the medicine a good 2 or 3 months to see how much they will benefit symptoms.  Will also be important to follow-up with the planned neurology appointment.

## 2023-01-17 ENCOUNTER — Ambulatory Visit
Admission: RE | Admit: 2023-01-17 | Discharge: 2023-01-17 | Disposition: A | Discharge status: Home / Self Care | Payer: Federal, State, Local not specified - PPO | Source: Ambulatory Visit | Attending: Family | Admitting: Family

## 2023-01-17 ENCOUNTER — Other Ambulatory Visit: Payer: Self-pay | Admitting: Family

## 2023-01-17 DIAGNOSIS — N6452 Nipple discharge: Secondary | ICD-10-CM

## 2023-01-17 DIAGNOSIS — N6320 Unspecified lump in the left breast, unspecified quadrant: Secondary | ICD-10-CM

## 2023-01-17 DIAGNOSIS — N63 Unspecified lump in unspecified breast: Secondary | ICD-10-CM | POA: Diagnosis not present

## 2023-01-17 DIAGNOSIS — R92333 Mammographic heterogeneous density, bilateral breasts: Secondary | ICD-10-CM | POA: Diagnosis not present

## 2023-01-17 DIAGNOSIS — R92332 Mammographic heterogeneous density, left breast: Secondary | ICD-10-CM | POA: Diagnosis not present

## 2023-01-17 DIAGNOSIS — R92331 Mammographic heterogeneous density, right breast: Secondary | ICD-10-CM | POA: Diagnosis not present

## 2023-01-18 DIAGNOSIS — Z23 Encounter for immunization: Secondary | ICD-10-CM | POA: Diagnosis not present

## 2023-01-19 ENCOUNTER — Other Ambulatory Visit: Payer: Self-pay | Admitting: Family

## 2023-01-19 DIAGNOSIS — N6452 Nipple discharge: Secondary | ICD-10-CM

## 2023-01-31 DIAGNOSIS — M62838 Other muscle spasm: Secondary | ICD-10-CM | POA: Diagnosis not present

## 2023-01-31 DIAGNOSIS — M6281 Muscle weakness (generalized): Secondary | ICD-10-CM | POA: Diagnosis not present

## 2023-01-31 DIAGNOSIS — M6289 Other specified disorders of muscle: Secondary | ICD-10-CM | POA: Diagnosis not present

## 2023-01-31 DIAGNOSIS — R102 Pelvic and perineal pain: Secondary | ICD-10-CM | POA: Diagnosis not present

## 2023-02-14 DIAGNOSIS — M6281 Muscle weakness (generalized): Secondary | ICD-10-CM | POA: Diagnosis not present

## 2023-02-14 DIAGNOSIS — R102 Pelvic and perineal pain: Secondary | ICD-10-CM | POA: Diagnosis not present

## 2023-02-14 DIAGNOSIS — M62838 Other muscle spasm: Secondary | ICD-10-CM | POA: Diagnosis not present

## 2023-02-14 DIAGNOSIS — M6289 Other specified disorders of muscle: Secondary | ICD-10-CM | POA: Diagnosis not present

## 2023-02-15 ENCOUNTER — Other Ambulatory Visit: Payer: Federal, State, Local not specified - PPO

## 2023-02-28 ENCOUNTER — Other Ambulatory Visit: Payer: Federal, State, Local not specified - PPO

## 2023-03-01 ENCOUNTER — Ambulatory Visit
Admission: RE | Admit: 2023-03-01 | Discharge: 2023-03-01 | Disposition: A | Discharge status: Home / Self Care | Payer: Federal, State, Local not specified - PPO | Source: Ambulatory Visit | Attending: Family | Admitting: Family

## 2023-03-01 DIAGNOSIS — N6452 Nipple discharge: Secondary | ICD-10-CM | POA: Diagnosis not present

## 2023-03-01 DIAGNOSIS — N644 Mastodynia: Secondary | ICD-10-CM | POA: Diagnosis not present

## 2023-03-01 MED ORDER — GADOPICLENOL 0.5 MMOL/ML IV SOLN
8.0000 mL | Freq: Once | INTRAVENOUS | Status: AC | PRN
  Administered 2023-03-01: 8 mL via INTRAVENOUS

## 2023-03-08 DIAGNOSIS — M6289 Other specified disorders of muscle: Secondary | ICD-10-CM | POA: Diagnosis not present

## 2023-03-08 DIAGNOSIS — M62838 Other muscle spasm: Secondary | ICD-10-CM | POA: Diagnosis not present

## 2023-03-08 DIAGNOSIS — M6281 Muscle weakness (generalized): Secondary | ICD-10-CM | POA: Diagnosis not present

## 2023-03-08 DIAGNOSIS — R102 Pelvic and perineal pain: Secondary | ICD-10-CM | POA: Diagnosis not present

## 2023-03-13 ENCOUNTER — Encounter: Payer: Self-pay | Admitting: Diagnostic Neuroimaging

## 2023-03-13 ENCOUNTER — Ambulatory Visit: Payer: Federal, State, Local not specified - PPO | Admitting: Diagnostic Neuroimaging

## 2023-03-13 VITALS — BP 116/81 | HR 73 | Ht 59.0 in | Wt 190.2 lb

## 2023-03-13 DIAGNOSIS — U099 Post covid-19 condition, unspecified: Secondary | ICD-10-CM

## 2023-03-13 NOTE — Patient Instructions (Signed)
  NUMBNESS, FATIGUE, INSOMNIA, TEMPERATURE CHANGES / INTOLERANCE, HEADACHES (since Jan 2022 COVID) - check MRI brain - continue supportive care; consider duloxetine; consider psychiatry / psychology for anxiety / insomnia - optimize nutrition, exercise, sleep, stress mgmt

## 2023-03-13 NOTE — Progress Notes (Unsigned)
[facility]  PATIENT: Savannah Beck DOB: 10-12-1990  REFERRING CLINICIAN: Farris Has, MD HISTORY FROM: *** REASON FOR VISIT: ***   HISTORICAL  CHIEF COMPLAINT:  Chief Complaint  Patient presents with   New Patient (Initial Visit)    Patient in room #7 with her husband. Patient states she has issues with insomnia and memory. Patient states she been sleep walking lately, tingling and numbness on the right side of her body and headaches.    HISTORY OF PRESENT ILLNESS:   ***  REVIEW OF SYSTEMS: Full 14 system review of systems performed and negative with exception of: ***  ALLERGIES: Allergies  Allergen Reactions   Dicyclomine Hcl Other (See Comments)   Hyoscyamine Other (See Comments)   Other Itching    Cilantro-    Pine Other (See Comments)   Amoxicillin Hives and Rash    Reaction occurred to patient's identical twin sister: Rash up and down both legs instantly (from Amoxicillin)   Penicillins Hives and Rash    Reaction (to Amoxicillin) occurred to patient's identical twin sister: Did it involve swelling of the face/tongue/throat, SOB, or low BP? No Did it involve sudden or severe rash/hives, skin peeling, or any reaction on the inside of your mouth or nose? Unk Did you need to seek medical attention at a hospital or doctor's office? Yes When did it last happen? "years ago, to identical sister" If all above answers are "NO", may proceed with cephalosporin use.     HOME MEDICATIONS: Outpatient Medications Prior to Visit  Medication Sig Dispense Refill   cetirizine (ZYRTEC) 10 MG tablet Take 10 mg by mouth daily.     DULoxetine (CYMBALTA) 30 MG capsule Take 1 capsule (30 mg total) by mouth daily. 30 capsule 3   Vitamin D, Ergocalciferol, (DRISDOL) 1.25 MG (50000 UNIT) CAPS capsule PLEASE SEE ATTACHED FOR DETAILED DIRECTIONS     doxycycline (VIBRAMYCIN) 100 MG capsule Take 1 capsule (100 mg total) by mouth 2 (two) times daily. (Patient not  taking: Reported on 01/11/2023) 20 capsule 0   fluticasone (FLONASE) 50 MCG/ACT nasal spray Place 1-2 sprays into both nostrils daily as needed for allergies or rhinitis.  (Patient not taking: Reported on 01/11/2023)     hydrOXYzine (ATARAX/VISTARIL) 50 MG tablet Take 50 mg by mouth daily as needed for anxiety. (Patient not taking: Reported on 01/11/2023)     methocarbamol (ROBAXIN) 500 MG tablet Take 1.5 tablets (750 mg total) by mouth 3 (three) times daily as needed (muscle spasm/pain). (Patient not taking: Reported on 01/11/2023) 22 tablet 0   polyethylene glycol powder (GLYCOLAX/MIRALAX) 17 GM/SCOOP powder as needed. (Patient not taking: Reported on 01/11/2023)     No facility-administered medications prior to visit.    PAST MEDICAL HISTORY: Past Medical History:  Diagnosis Date   Allergic rhinitis    Bilateral ovarian cysts    Hyperlipidemia    Long COVID    Raynaud's disease    post-Covid    PAST SURGICAL HISTORY: Past Surgical History:  Procedure Laterality Date   ABLATION SAPHENOUS VEIN W/ RFA     twice in left leg   ANGIOMA CAUTERY Right    BREAST BIOPSY     LASER ABLATION OF VASCULAR LESION Left    x2    FAMILY HISTORY: Family History  Problem Relation Age of Onset   Thyroid disease Mother    Diabetes Mother    Hypertension Mother    Cancer Father        Blood  Kidney cancer Father    Melanoma Father    Hypertension Father    Polycystic ovary syndrome Sister    Fibromyalgia Sister    Migraines Sister    Bipolar disorder Sister    Migraines Sister    Asthma Sister    Fibromyalgia Sister    Depression Brother    Anxiety disorder Brother    Anxiety disorder Brother    Depression Brother    Autoimmune disease Brother    Multiple sclerosis Maternal Aunt    Breast cancer Paternal Grandmother        unknown   Breast cancer Cousin        61s   Diabetes Other    CAD Other    Alzheimer's disease Other    Asthma Other    Hypertension Other     SOCIAL  HISTORY: Social History   Socioeconomic History   Marital status: Married    Spouse name: Not on file   Number of children: Not on file   Years of education: Not on file   Highest education level: Not on file  Occupational History   Not on file  Tobacco Use   Smoking status: Never    Passive exposure: Never   Smokeless tobacco: Never  Vaping Use   Vaping status: Never Used  Substance and Sexual Activity   Alcohol use: Yes    Comment: once or twice weekly   Drug use: No   Sexual activity: Not on file  Other Topics Concern   Not on file  Social History Narrative   Not on file   Social Determinants of Health   Financial Resource Strain: Not on file  Food Insecurity: Not on file  Transportation Needs: Not on file  Physical Activity: Not on file  Stress: Not on file  Social Connections: Unknown (01/10/2022)   Received from [facility]   Social Network    Social Network: Not on file  Intimate Partner Violence: Unknown (12/02/2021)   Received from [facility]   HITS    Physically Hurt: Not on file    Insult or Talk Down To: Not on file    Threaten Physical Harm: Not on file    Scream or Curse: Not on file     PHYSICAL EXAM  ***  GENERAL EXAM/CONSTITUTIONAL: Vitals:  Vitals:   03/13/23 0819  BP: 116/81  Pulse: 73  Weight: 190 lb 3.2 oz (86.3 kg)  Height: 4\' 11"  (1.499 m)   Body mass index is 38.42 kg/m. Wt Readings from Last 3 Encounters:  03/13/23 190 lb 3.2 oz (86.3 kg)  01/11/23 188 lb (85.3 kg)  08/08/22 190 lb (86.2 kg)   Patient is in no distress; well developed, nourished and groomed; neck is supple  CARDIOVASCULAR: Examination of carotid arteries is normal; no carotid bruits Regular rate and rhythm, no murmurs Examination of peripheral vascular system by observation and palpation is normal  EYES: Ophthalmoscopic exam of optic discs and posterior segments is normal; no papilledema or hemorrhages No results found.  MUSCULOSKELETAL: Gait,  strength, tone, movements noted in Neurologic exam below  NEUROLOGIC: MENTAL STATUS:      No data to display         awake, alert, oriented to person, place and time recent and remote memory intact normal attention and concentration language fluent, comprehension intact, naming intact fund of knowledge appropriate  CRANIAL NERVE:  2nd - no papilledema on fundoscopic exam 2nd, 3rd, 4th, 6th - pupils equal and reactive to  light, visual fields full to confrontation, extraocular muscles intact, no nystagmus 5th - facial sensation symmetric 7th - facial strength symmetric 8th - hearing intact 9th - palate elevates symmetrically, uvula midline 11th - shoulder shrug symmetric 12th - tongue protrusion midline  MOTOR:  normal bulk and tone, full strength in the BUE, BLE  SENSORY:  normal and symmetric to light touch, pinprick, temperature, vibration  COORDINATION:  finger-nose-finger, fine finger movements normal  REFLEXES:  deep tendon reflexes present and symmetric  GAIT/STATION:  narrow based gait; able to walk on toes, heels and tandem; romberg is negative     DIAGNOSTIC DATA (LABS, IMAGING, TESTING) - I reviewed patient records, labs, notes, testing and imaging myself where available.  Lab Results  Component Value Date   WBC 6.0 05/25/2021   HGB 13.1 05/25/2021   HCT 39.2 05/25/2021   MCV 89.3 05/25/2021   PLT 249 05/25/2021      Component Value Date/Time   NA 140 05/25/2021 1355   NA 137 01/14/2021 1436   K 3.8 05/25/2021 1355   CL 108 05/25/2021 1355   CO2 24 05/25/2021 1355   GLUCOSE 105 (H) 05/25/2021 1355   BUN 7 05/25/2021 1355   BUN 7 01/14/2021 1436   CREATININE 0.79 05/25/2021 1355   CALCIUM 9.4 05/25/2021 1355   PROT 6.9 05/25/2021 1355   PROT 7.0 01/14/2021 1436   ALBUMIN 4.5 05/25/2021 1355   ALBUMIN 4.8 01/14/2021 1436   AST 13 (L) 05/25/2021 1355   ALT 13 05/25/2021 1355   ALKPHOS 57 05/25/2021 1355   BILITOT 1.1 05/25/2021 1355    BILITOT 1.0 01/14/2021 1436   GFRNONAA >60 05/25/2021 1355   GFRAA >60 03/09/2020 0603   No results found for: "CHOL", "HDL", "LDLCALC", "LDLDIRECT", "TRIG", "CHOLHDL" No results found for: "HGBA1C" No results found for: "VITAMINB12" Lab Results  Component Value Date   TSH 1.170 01/14/2021    ***    ASSESSMENT AND PLAN  32 y.o. year old female here with ***  Dx:  1. Post-COVID syndrome     PLAN:  NUMBNESS, FATIGUE, INSOMNIA, TEMPERATURE CHANGES / INTOLERANCE, HEADACHES (since Jan 2022 COVID) - check MRI brain - continue supportive care; consider duloxetine; consider psychiatry / psychology for anxiety / insomnia - optimize nutrition, exercise, sleep, stress mgmt  Orders Placed This Encounter  Procedures   MR BRAIN W WO CONTRAST   Return for pending test results, pending if symptoms worsen or fail to improve.    Suanne Marker, MD 03/13/2023, 9:54 AM Certified in Neurology, Neurophysiology and Neuroimaging  [facility] [address] [place], [place] [address] [phone]

## 2023-03-14 ENCOUNTER — Telehealth: Payer: Self-pay | Admitting: Diagnostic Neuroimaging

## 2023-03-14 NOTE — Telephone Encounter (Signed)
Pt scheduled for 45 mins MR brain w/wo contrast at [facility] for 03/15/23 at 9:30am  [employer]

## 2023-03-15 ENCOUNTER — Ambulatory Visit: Payer: Federal, State, Local not specified - PPO

## 2023-03-15 DIAGNOSIS — U099 Post covid-19 condition, unspecified: Secondary | ICD-10-CM | POA: Diagnosis not present

## 2023-03-15 MED ORDER — GADOBENATE DIMEGLUMINE 529 MG/ML IV SOLN
15.0000 mL | Freq: Once | INTRAVENOUS | Status: AC | PRN
  Administered 2023-03-15: 15 mL via INTRAVENOUS

## 2023-03-29 DIAGNOSIS — R102 Pelvic and perineal pain: Secondary | ICD-10-CM | POA: Diagnosis not present

## 2023-03-29 DIAGNOSIS — M6281 Muscle weakness (generalized): Secondary | ICD-10-CM | POA: Diagnosis not present

## 2023-03-29 DIAGNOSIS — M62838 Other muscle spasm: Secondary | ICD-10-CM | POA: Diagnosis not present

## 2023-03-29 DIAGNOSIS — M6289 Other specified disorders of muscle: Secondary | ICD-10-CM | POA: Diagnosis not present

## 2023-03-31 DIAGNOSIS — F3289 Other specified depressive episodes: Secondary | ICD-10-CM | POA: Diagnosis not present

## 2023-03-31 DIAGNOSIS — F411 Generalized anxiety disorder: Secondary | ICD-10-CM | POA: Diagnosis not present

## 2023-04-06 DIAGNOSIS — F411 Generalized anxiety disorder: Secondary | ICD-10-CM | POA: Diagnosis not present

## 2023-04-06 DIAGNOSIS — F3289 Other specified depressive episodes: Secondary | ICD-10-CM | POA: Diagnosis not present

## 2023-04-20 DIAGNOSIS — F3289 Other specified depressive episodes: Secondary | ICD-10-CM | POA: Diagnosis not present

## 2023-04-20 DIAGNOSIS — F411 Generalized anxiety disorder: Secondary | ICD-10-CM | POA: Diagnosis not present

## 2023-04-27 DIAGNOSIS — R102 Pelvic and perineal pain: Secondary | ICD-10-CM | POA: Diagnosis not present

## 2023-04-27 DIAGNOSIS — M62838 Other muscle spasm: Secondary | ICD-10-CM | POA: Diagnosis not present

## 2023-04-27 DIAGNOSIS — M6289 Other specified disorders of muscle: Secondary | ICD-10-CM | POA: Diagnosis not present

## 2023-04-27 DIAGNOSIS — M6281 Muscle weakness (generalized): Secondary | ICD-10-CM | POA: Diagnosis not present

## 2023-05-06 ENCOUNTER — Other Ambulatory Visit: Payer: Self-pay

## 2023-05-06 ENCOUNTER — Encounter (HOSPITAL_BASED_OUTPATIENT_CLINIC_OR_DEPARTMENT_OTHER): Payer: Self-pay | Admitting: Emergency Medicine

## 2023-05-06 ENCOUNTER — Emergency Department (HOSPITAL_BASED_OUTPATIENT_CLINIC_OR_DEPARTMENT_OTHER)
Admission: EM | Admit: 2023-05-06 | Discharge: 2023-05-06 | Disposition: A | Discharge status: Home / Self Care | Payer: Federal, State, Local not specified - PPO | Attending: Emergency Medicine | Admitting: Emergency Medicine

## 2023-05-06 DIAGNOSIS — S81032A Puncture wound without foreign body, left knee, initial encounter: Secondary | ICD-10-CM | POA: Insufficient documentation

## 2023-05-06 DIAGNOSIS — S91332A Puncture wound without foreign body, left foot, initial encounter: Secondary | ICD-10-CM | POA: Diagnosis not present

## 2023-05-06 DIAGNOSIS — Y92007 Garden or yard of unspecified non-institutional (private) residence as the place of occurrence of the external cause: Secondary | ICD-10-CM | POA: Insufficient documentation

## 2023-05-06 DIAGNOSIS — W2209XA Striking against other stationary object, initial encounter: Secondary | ICD-10-CM | POA: Insufficient documentation

## 2023-05-06 DIAGNOSIS — T148XXA Other injury of unspecified body region, initial encounter: Secondary | ICD-10-CM

## 2023-05-06 MED ORDER — CEPHALEXIN 500 MG PO CAPS: 500.0000 mg | ORAL_CAPSULE | Freq: Four times a day (QID) | ORAL | 0 refills | Status: AC

## 2023-05-06 MED ORDER — CEPHALEXIN 250 MG PO CAPS
500.0000 mg | ORAL_CAPSULE | Freq: Once | ORAL | Status: AC
  Administered 2023-05-06: 500 mg via ORAL
  Filled 2023-05-06: qty 2

## 2023-05-06 NOTE — ED Triage Notes (Signed)
Left leg injury,  Walked into a tree branch yesterday while doing yard work. Puncture wound Pulled splinter out of leg. Concerned about pain and redness. Report elevated temp from her normal.

## 2023-05-06 NOTE — Discharge Instructions (Signed)
You are seen in the emergency department for your puncture wound.  It did not appear infected at this time but when you have a puncture it is at higher risk of infection so I have given you a short course of prophylactic antibiotics.  You should continue to wash this daily with soap and water and you can ice your knee and take Tylenol and Motrin as needed for pain.  You can follow-up with your primary doctor to have your symptoms rechecked.  You should return to the emergency department if you have any fevers despite the antibiotics, spreading redness from the wound, pus drainage or any other new or concerning symptoms.

## 2023-05-06 NOTE — ED Notes (Signed)
Dc instructions reviewed with patient. Patient voiced understanding. Dc with belongings.  °

## 2023-05-06 NOTE — ED Provider Notes (Signed)
[place] EMERGENCY DEPARTMENT AT [facility] Provider Note   CSN: [id number] Arrival date & time: 05/06/23  1716     History  Chief Complaint  Patient presents with   Leg Injury    Savannah Beck is a 32 y.o. female.  Patient is a 32 year old female no significant past medical history presenting to the emergency department with a puncture wound to her left leg.  She states that she was mowing the lawn yesterday and accidentally backed into a branch and sustained a puncture wound to the back of her left knee.  She states that she washed it out and her mother-in-law removed at least 2 or 3 large splinters from the wound.  She states that she has had increased pain today and states that she feels a little warmer than usual today so decided to come to the emergency department to be evaluated.  She denies any pus drainage, redness or warmth.  She states that her tetanus is up-to-date.  The history is provided by the patient and the spouse.       Home Medications Prior to Admission medications   Medication Sig Start Date End Date Taking? Authorizing Provider  cephALEXin (KEFLEX) 500 MG capsule Take 1 capsule (500 mg total) by mouth 4 (four) times daily. 05/06/23  Yes Theresia Lo, Benetta Spar K, DO  cetirizine (ZYRTEC) 10 MG tablet Take 10 mg by mouth daily.    [provider]  doxycycline (VIBRAMYCIN) 100 MG capsule Take 1 capsule (100 mg total) by mouth 2 (two) times daily. Patient not taking: Reported on 01/11/2023 08/14/21   Redwine, Madison A, PA-C  DULoxetine (CYMBALTA) 30 MG capsule Take 1 capsule (30 mg total) by mouth daily. 01/13/23   Fuller Plan, MD  fluticasone (FLONASE) 50 MCG/ACT nasal spray Place 1-2 sprays into both nostrils daily as needed for allergies or rhinitis.  Patient not taking: Reported on 01/11/2023    [provider]  hydrOXYzine (ATARAX/VISTARIL) 50 MG tablet Take 50 mg by mouth daily as needed for anxiety. Patient not taking:  Reported on 01/11/2023    [provider]  methocarbamol (ROBAXIN) 500 MG tablet Take 1.5 tablets (750 mg total) by mouth 3 (three) times daily as needed (muscle spasm/pain). Patient not taking: Reported on 01/11/2023 05/25/21   Cathren Laine, MD  polyethylene glycol powder [facility]) 17 GM/SCOOP powder as needed. Patient not taking: Reported on 01/11/2023    [provider]  Vitamin D, Ergocalciferol, (DRISDOL) 1.25 MG (50000 UNIT) CAPS capsule PLEASE SEE ATTACHED FOR DETAILED DIRECTIONS 12/20/22   [provider]      Allergies    Dicyclomine hcl, Hyoscyamine, Other, Pine, Amoxicillin, and Penicillins    Review of Systems   Review of Systems  Physical Exam Updated Vital Signs BP 118/73 (BP Location: Right Arm)   Pulse 82   Temp 98.5 F (36.9 C)   Resp 18   LMP 04/16/2023   SpO2 100%  Physical Exam Vitals and nursing note reviewed.  Constitutional:      General: She is not in acute distress.    Appearance: Normal appearance.  HENT:     Head: Normocephalic.     Nose: Nose normal.     Mouth/Throat:     Mouth: Mucous membranes are moist.  Eyes:     Extraocular Movements: Extraocular movements intact.  Cardiovascular:     Rate and Rhythm: Normal rate.  Pulmonary:     Effort: Pulmonary effort is normal.  Musculoskeletal:  General: Normal range of motion.     Cervical back: Normal range of motion.  Skin:    General: Skin is warm and dry.     Comments: Small puncture wound posterior to the left knee with surrounding contusion, no active drainage, no visible foreign body  Neurological:     Mental Status: She is alert and oriented to person, place, and time.  Psychiatric:        Mood and Affect: Mood normal.        Behavior: Behavior normal.     ED Results / Procedures / Treatments   Labs (all labs ordered are listed, but only abnormal results are displayed) Labs Reviewed - No data to display  EKG None  Radiology No results  found.  Procedures Ultrasound ED Soft Tissue  Date/Time: 05/06/2023 5:53 PM  Performed by: Rexford Maus, DO Authorized by: Rexford Maus, DO   Procedure details:    Indications: evaluate for foreign body     Transverse view:  Visualized   Longitudinal view:  Visualized   Images: archived   Location:    Location: lower extremity     Side:  Left Findings:     no cellulitis present    no foreign body present     Medications Ordered in ED Medications  cephALEXin (KEFLEX) capsule 500 mg (has no administration in time range)    ED Course/ Medical Decision Making/ A&P                                 Medical Decision Making This patient presents to the ED with chief complaint(s) of puncture wound with no pertinent past medical history which further complicates the presenting complaint. The complaint involves an extensive differential diagnosis and also carries with it a high risk of complications and morbidity.    The differential diagnosis includes puncture wound, no signs of cellulitis, contusion, foreign body  Additional history obtained: Additional history obtained from family Records reviewed N/A  ED Course and Reassessment: On patient's arrival she is hemodynamically stable in no acute distress.  She does have bruising to the posterior to her left knee with an obvious puncture wound.  Bedside ultrasound was performed that showed no evidence of foreign body.  Due to the puncture nature of the wound she is at higher risk of infection and will be placed on short course of prophylactic antibiotics.  She is up-to-date on her tetanus and does not require tetanus update today.  She was recommended close primary care follow-up and was given strict return precautions.  Independent labs interpretation:  - N/A  Independent visualization of imaging: - N/A  Consultation: - Consulted or discussed management/test interpretation w/ external professional:  N/A  Consideration for admission or further workup: Patient has no emergent conditions requiring admission or further work-up at this time and is stable for discharge home with primary care follow-up  Social Determinants of health: N/A            Final Clinical Impression(s) / ED Diagnoses Final diagnoses:  Puncture wound    Rx / DC Orders ED Discharge Orders          Ordered    cephALEXin (KEFLEX) 500 MG capsule  4 times daily        05/06/23 1758              Rexford Maus, DO 05/06/23 1759

## 2023-05-09 DIAGNOSIS — S81802A Unspecified open wound, left lower leg, initial encounter: Secondary | ICD-10-CM | POA: Diagnosis not present

## 2023-05-18 DIAGNOSIS — M6281 Muscle weakness (generalized): Secondary | ICD-10-CM | POA: Diagnosis not present

## 2023-05-18 DIAGNOSIS — M62838 Other muscle spasm: Secondary | ICD-10-CM | POA: Diagnosis not present

## 2023-05-18 DIAGNOSIS — M6289 Other specified disorders of muscle: Secondary | ICD-10-CM | POA: Diagnosis not present

## 2023-05-18 DIAGNOSIS — R102 Pelvic and perineal pain: Secondary | ICD-10-CM | POA: Diagnosis not present

## 2023-06-13 DIAGNOSIS — Z113 Encounter for screening for infections with a predominantly sexual mode of transmission: Secondary | ICD-10-CM | POA: Diagnosis not present

## 2023-06-13 DIAGNOSIS — Z01419 Encounter for gynecological examination (general) (routine) without abnormal findings: Secondary | ICD-10-CM | POA: Diagnosis not present

## 2023-06-13 DIAGNOSIS — Z6839 Body mass index (BMI) 39.0-39.9, adult: Secondary | ICD-10-CM | POA: Diagnosis not present

## 2023-07-26 DIAGNOSIS — D2262 Melanocytic nevi of left upper limb, including shoulder: Secondary | ICD-10-CM | POA: Diagnosis not present

## 2023-07-26 DIAGNOSIS — D225 Melanocytic nevi of trunk: Secondary | ICD-10-CM | POA: Diagnosis not present

## 2023-07-26 DIAGNOSIS — D2261 Melanocytic nevi of right upper limb, including shoulder: Secondary | ICD-10-CM | POA: Diagnosis not present

## 2023-07-26 DIAGNOSIS — L858 Other specified epidermal thickening: Secondary | ICD-10-CM | POA: Diagnosis not present

## 2023-08-15 ENCOUNTER — Other Ambulatory Visit: Payer: Self-pay

## 2023-08-15 ENCOUNTER — Encounter (HOSPITAL_BASED_OUTPATIENT_CLINIC_OR_DEPARTMENT_OTHER): Payer: Self-pay | Admitting: Emergency Medicine

## 2023-08-15 DIAGNOSIS — R110 Nausea: Secondary | ICD-10-CM | POA: Diagnosis not present

## 2023-08-15 DIAGNOSIS — R1084 Generalized abdominal pain: Secondary | ICD-10-CM | POA: Diagnosis not present

## 2023-08-15 DIAGNOSIS — K5900 Constipation, unspecified: Secondary | ICD-10-CM | POA: Insufficient documentation

## 2023-08-15 LAB — PREGNANCY, URINE: Preg Test, Ur: NEGATIVE

## 2023-08-15 LAB — CBC
HCT: 38.8 % (ref 36.0–46.0)
Hemoglobin: 13.3 g/dL (ref 12.0–15.0)
MCH: 30.6 pg (ref 26.0–34.0)
MCHC: 34.3 g/dL (ref 30.0–36.0)
MCV: 89.2 fL (ref 80.0–100.0)
Platelets: 257 10*3/uL (ref 150–400)
RBC: 4.35 MIL/uL (ref 3.87–5.11)
RDW: 12.6 % (ref 11.5–15.5)
WBC: 9 10*3/uL (ref 4.0–10.5)
nRBC: 0 % (ref 0.0–0.2)

## 2023-08-15 LAB — COMPREHENSIVE METABOLIC PANEL
ALT: 11 U/L (ref 0–44)
AST: 11 U/L — ABNORMAL LOW (ref 15–41)
Albumin: 4.3 g/dL (ref 3.5–5.0)
Alkaline Phosphatase: 52 U/L (ref 38–126)
Anion gap: 7 (ref 5–15)
BUN: 14 mg/dL (ref 6–20)
CO2: 24 mmol/L (ref 22–32)
Calcium: 8.7 mg/dL — ABNORMAL LOW (ref 8.9–10.3)
Chloride: 106 mmol/L (ref 98–111)
Creatinine, Ser: 0.67 mg/dL (ref 0.44–1.00)
GFR, Estimated: >60 mL/min (ref >60)
Glucose, Bld: 100 mg/dL — ABNORMAL HIGH (ref 70–99)
Potassium: 3.7 mmol/L (ref 3.5–5.1)
Sodium: 137 mmol/L (ref 135–145)
Total Bilirubin: 0.6 mg/dL (ref <1.2)
Total Protein: 6.8 g/dL (ref 6.5–8.1)

## 2023-08-15 LAB — URINALYSIS, ROUTINE W REFLEX MICROSCOPIC
Bilirubin Urine: NEGATIVE
Glucose, UA: NEGATIVE mg/dL
Hgb urine dipstick: NEGATIVE
Ketones, ur: NEGATIVE mg/dL
Leukocytes,Ua: NEGATIVE
Nitrite: NEGATIVE
Protein, ur: NEGATIVE mg/dL
Specific Gravity, Urine: 1.005 (ref 1.005–1.030)
pH: 7.5 (ref 5.0–8.0)

## 2023-08-15 LAB — LIPASE, BLOOD: Lipase: 23 U/L (ref 11–51)

## 2023-08-15 NOTE — ED Triage Notes (Signed)
Abdo pain x couple weeks. Constipation Worse about 1 week ago. Tonight, rectal pain, pressure burning and blood in toilet

## 2023-08-16 ENCOUNTER — Emergency Department (HOSPITAL_BASED_OUTPATIENT_CLINIC_OR_DEPARTMENT_OTHER)
Admission: EM | Admit: 2023-08-16 | Discharge: 2023-08-16 | Disposition: A | Discharge status: Home / Self Care | Payer: Federal, State, Local not specified - PPO | Attending: Emergency Medicine | Admitting: Emergency Medicine

## 2023-08-16 ENCOUNTER — Emergency Department (HOSPITAL_BASED_OUTPATIENT_CLINIC_OR_DEPARTMENT_OTHER): Payer: Federal, State, Local not specified - PPO

## 2023-08-16 DIAGNOSIS — R1084 Generalized abdominal pain: Secondary | ICD-10-CM

## 2023-08-16 DIAGNOSIS — K5900 Constipation, unspecified: Secondary | ICD-10-CM

## 2023-08-16 DIAGNOSIS — R109 Unspecified abdominal pain: Secondary | ICD-10-CM | POA: Diagnosis not present

## 2023-08-16 MED ORDER — IOHEXOL 300 MG/ML  SOLN
100.0000 mL | Freq: Once | INTRAMUSCULAR | Status: AC | PRN
  Administered 2023-08-16: 100 mL via INTRAVENOUS

## 2023-08-16 NOTE — ED Provider Notes (Signed)
[place] EMERGENCY DEPARTMENT AT [facility]  Provider Note  CSN: [id number] Arrival date & time: 08/15/23 2248  History Chief Complaint  Patient presents with   Abdominal Pain    Savannah Beck is a 32 y.o. female reports intermittent abdominal pain, decreased BM and thin stools for the last several weeks, attributed to life stressors. Tonight after a particularly hard stool, she noticed some blood in the toilet. She has had some nausea but no vomiting. Has never had colonoscopy but has had issues with constipation occasionally. Has not tried taking anything OTC.    Home Medications Prior to Admission medications   Medication Sig Start Date End Date Taking? Authorizing Provider  cephALEXin (KEFLEX) 500 MG capsule Take 1 capsule (500 mg total) by mouth 4 (four) times daily. 05/06/23   Elayne Snare K, DO  cetirizine (ZYRTEC) 10 MG tablet Take 10 mg by mouth daily.    [provider]  doxycycline (VIBRAMYCIN) 100 MG capsule Take 1 capsule (100 mg total) by mouth 2 (two) times daily. Patient not taking: Reported on 01/11/2023 08/14/21   Redwine, Madison A, PA-C  DULoxetine (CYMBALTA) 30 MG capsule Take 1 capsule (30 mg total) by mouth daily. 01/13/23   Fuller Plan, MD  fluticasone (FLONASE) 50 MCG/ACT nasal spray Place 1-2 sprays into both nostrils daily as needed for allergies or rhinitis.  Patient not taking: Reported on 01/11/2023    [provider]  hydrOXYzine (ATARAX/VISTARIL) 50 MG tablet Take 50 mg by mouth daily as needed for anxiety. Patient not taking: Reported on 01/11/2023    [provider]  methocarbamol (ROBAXIN) 500 MG tablet Take 1.5 tablets (750 mg total) by mouth 3 (three) times daily as needed (muscle spasm/pain). Patient not taking: Reported on 01/11/2023 05/25/21   Cathren Laine, MD  polyethylene glycol powder [facility]) 17 GM/SCOOP powder as needed. Patient not taking: Reported on 01/11/2023    [provider]  Vitamin D, Ergocalciferol, (DRISDOL) 1.25 MG (50000 UNIT) CAPS capsule PLEASE SEE ATTACHED FOR DETAILED DIRECTIONS 12/20/22   [provider]     Allergies    Dicyclomine hcl, Hyoscyamine, Other, Pine, Amoxicillin, and Penicillins   Review of Systems   Review of Systems Please see HPI for pertinent positives and negatives  Physical Exam BP 134/81   Pulse 95   Temp 98.3 F (36.8 C)   Resp 18   LMP 08/01/2023   SpO2 100%   Physical Exam Vitals and nursing note reviewed.  Constitutional:      Appearance: Normal appearance.  HENT:     Head: Normocephalic and atraumatic.     Nose: Nose normal.     Mouth/Throat:     Mouth: Mucous membranes are moist.  Eyes:     Extraocular Movements: Extraocular movements intact.     Conjunctiva/sclera: Conjunctivae normal.  Cardiovascular:     Rate and Rhythm: Normal rate.  Pulmonary:     Effort: Pulmonary effort is normal.     Breath sounds: Normal breath sounds.  Abdominal:     General: Abdomen is flat.     Palpations: Abdomen is soft.     Tenderness: There is no abdominal tenderness. There is no guarding. Negative signs include Murphy's sign and McBurney's sign.  Musculoskeletal:        General: No swelling. Normal range of motion.     Cervical back: Neck supple.  Skin:    General: Skin is warm and dry.  Neurological:     General: No  focal deficit present.     Mental Status: She is alert.  Psychiatric:        Mood and Affect: Mood normal.     ED Results / Procedures / Treatments   EKG None  Procedures Procedures  Medications Ordered in the ED Medications  iohexol (OMNIPAQUE) 300 MG/ML solution 100 mL (100 mLs Intravenous Contrast Given 08/16/23 0455)    Initial Impression and Plan  Patient here for abdominal pain, likely related to constipation. Labs done in triage show normal CBC, CMP, lipase and UA. Will check CT for any structural problems causing her symptoms.   ED Course   Clinical  Course as of 08/16/23 0555  Wed Aug 16, 2023  [id number] I personally viewed the images from radiology studies and agree with radiologist interpretation: CT is neg for acute process. Patient is sleeping comfortably on re-assessment. Recommend outpatient GI follow up. RTED for any other concerns.  [CS]    Clinical Course User Index [CS] Pollyann Savoy, MD     MDM Rules/Calculators/A&P Medical Decision Making Problems Addressed: Constipation, unspecified constipation type: acute illness or injury Generalized abdominal pain: acute illness or injury  Amount and/or Complexity of Data Reviewed Labs: ordered. Decision-making details documented in ED Course. Radiology: ordered and independent interpretation performed. Decision-making details documented in ED Course.  Risk Prescription drug management.     Final Clinical Impression(s) / ED Diagnoses Final diagnoses:  Generalized abdominal pain  Constipation, unspecified constipation type    Rx / DC Orders ED Discharge Orders     None        Pollyann Savoy, MD 08/16/23 [phone]

## 2023-09-04 DIAGNOSIS — K5900 Constipation, unspecified: Secondary | ICD-10-CM | POA: Diagnosis not present

## 2023-09-04 DIAGNOSIS — K625 Hemorrhage of anus and rectum: Secondary | ICD-10-CM | POA: Diagnosis not present

## 2023-09-04 DIAGNOSIS — R197 Diarrhea, unspecified: Secondary | ICD-10-CM | POA: Diagnosis not present

## 2023-09-04 DIAGNOSIS — R194 Change in bowel habit: Secondary | ICD-10-CM | POA: Diagnosis not present

## 2023-09-08 DIAGNOSIS — M6289 Other specified disorders of muscle: Secondary | ICD-10-CM | POA: Diagnosis not present

## 2023-09-08 DIAGNOSIS — M6281 Muscle weakness (generalized): Secondary | ICD-10-CM | POA: Diagnosis not present

## 2023-09-08 DIAGNOSIS — M62838 Other muscle spasm: Secondary | ICD-10-CM | POA: Diagnosis not present

## 2023-09-08 DIAGNOSIS — R102 Pelvic and perineal pain: Secondary | ICD-10-CM | POA: Diagnosis not present

## 2023-10-05 DIAGNOSIS — K5289 Other specified noninfective gastroenteritis and colitis: Secondary | ICD-10-CM | POA: Diagnosis not present

## 2023-10-05 DIAGNOSIS — K625 Hemorrhage of anus and rectum: Secondary | ICD-10-CM | POA: Diagnosis not present

## 2023-10-05 DIAGNOSIS — R194 Change in bowel habit: Secondary | ICD-10-CM | POA: Diagnosis not present

## 2023-10-06 DIAGNOSIS — R42 Dizziness and giddiness: Secondary | ICD-10-CM | POA: Diagnosis not present

## 2023-10-06 DIAGNOSIS — Z03818 Encounter for observation for suspected exposure to other biological agents ruled out: Secondary | ICD-10-CM | POA: Diagnosis not present

## 2023-10-06 DIAGNOSIS — R110 Nausea: Secondary | ICD-10-CM | POA: Diagnosis not present

## 2023-10-20 DIAGNOSIS — M62838 Other muscle spasm: Secondary | ICD-10-CM | POA: Diagnosis not present

## 2023-10-20 DIAGNOSIS — M6289 Other specified disorders of muscle: Secondary | ICD-10-CM | POA: Diagnosis not present

## 2023-10-20 DIAGNOSIS — M6281 Muscle weakness (generalized): Secondary | ICD-10-CM | POA: Diagnosis not present

## 2023-12-13 DIAGNOSIS — R102 Pelvic and perineal pain: Secondary | ICD-10-CM | POA: Diagnosis not present

## 2023-12-13 DIAGNOSIS — M6281 Muscle weakness (generalized): Secondary | ICD-10-CM | POA: Diagnosis not present

## 2023-12-13 DIAGNOSIS — M6289 Other specified disorders of muscle: Secondary | ICD-10-CM | POA: Diagnosis not present

## 2023-12-13 DIAGNOSIS — M62838 Other muscle spasm: Secondary | ICD-10-CM | POA: Diagnosis not present

## 2023-12-28 DIAGNOSIS — Z0000 Encounter for general adult medical examination without abnormal findings: Secondary | ICD-10-CM | POA: Diagnosis not present

## 2023-12-28 DIAGNOSIS — K219 Gastro-esophageal reflux disease without esophagitis: Secondary | ICD-10-CM | POA: Diagnosis not present

## 2023-12-28 DIAGNOSIS — M7989 Other specified soft tissue disorders: Secondary | ICD-10-CM | POA: Diagnosis not present

## 2023-12-28 DIAGNOSIS — E785 Hyperlipidemia, unspecified: Secondary | ICD-10-CM | POA: Diagnosis not present

## 2023-12-28 DIAGNOSIS — Z13220 Encounter for screening for lipoid disorders: Secondary | ICD-10-CM | POA: Diagnosis not present

## 2023-12-28 DIAGNOSIS — E559 Vitamin D deficiency, unspecified: Secondary | ICD-10-CM | POA: Diagnosis not present

## 2024-01-19 DIAGNOSIS — M62838 Other muscle spasm: Secondary | ICD-10-CM | POA: Diagnosis not present

## 2024-01-19 DIAGNOSIS — M6281 Muscle weakness (generalized): Secondary | ICD-10-CM | POA: Diagnosis not present

## 2024-01-19 DIAGNOSIS — M6289 Other specified disorders of muscle: Secondary | ICD-10-CM | POA: Diagnosis not present

## 2024-01-19 DIAGNOSIS — R102 Pelvic and perineal pain: Secondary | ICD-10-CM | POA: Diagnosis not present

## 2024-02-07 DIAGNOSIS — M62838 Other muscle spasm: Secondary | ICD-10-CM | POA: Diagnosis not present

## 2024-02-07 DIAGNOSIS — R102 Pelvic and perineal pain: Secondary | ICD-10-CM | POA: Diagnosis not present

## 2024-02-07 DIAGNOSIS — M6289 Other specified disorders of muscle: Secondary | ICD-10-CM | POA: Diagnosis not present

## 2024-02-07 DIAGNOSIS — M6281 Muscle weakness (generalized): Secondary | ICD-10-CM | POA: Diagnosis not present

## 2024-03-08 DIAGNOSIS — F411 Generalized anxiety disorder: Secondary | ICD-10-CM | POA: Diagnosis not present

## 2024-03-13 DIAGNOSIS — F411 Generalized anxiety disorder: Secondary | ICD-10-CM | POA: Diagnosis not present

## 2024-03-14 DIAGNOSIS — F411 Generalized anxiety disorder: Secondary | ICD-10-CM | POA: Diagnosis not present

## 2024-03-14 DIAGNOSIS — B948 Sequelae of other specified infectious and parasitic diseases: Secondary | ICD-10-CM | POA: Diagnosis not present

## 2024-03-20 DIAGNOSIS — F411 Generalized anxiety disorder: Secondary | ICD-10-CM | POA: Diagnosis not present

## 2024-03-25 ENCOUNTER — Telehealth: Payer: Self-pay | Admitting: Internal Medicine

## 2024-03-25 NOTE — Telephone Encounter (Signed)
 I called patient, patient wants accomodation letter completed to work from home, patient will need appt if Dr. Jeannetta decides to complete accommodations. Patient will fax forms to office for Dr. Joyce review before appt is made if he agrees to complete accommodation. Patient is aware process can take up to 2 weeks.

## 2024-03-25 NOTE — Telephone Encounter (Signed)
 Pt called stating she has some questions to ask before her appt to see if she should keep it or cancel it. Pts appt is 05/01/24 Pt would like a call back.

## 2024-03-28 DIAGNOSIS — F411 Generalized anxiety disorder: Secondary | ICD-10-CM | POA: Diagnosis not present

## 2024-04-03 DIAGNOSIS — F411 Generalized anxiety disorder: Secondary | ICD-10-CM | POA: Diagnosis not present

## 2024-04-03 DIAGNOSIS — I87393 Chronic venous hypertension (idiopathic) with other complications of bilateral lower extremity: Secondary | ICD-10-CM | POA: Diagnosis not present

## 2024-04-03 DIAGNOSIS — I83813 Varicose veins of bilateral lower extremities with pain: Secondary | ICD-10-CM | POA: Diagnosis not present

## 2024-04-05 NOTE — Telephone Encounter (Signed)
 Still have not received the paperwork. Will update if we receive it.

## 2024-04-11 DIAGNOSIS — F411 Generalized anxiety disorder: Secondary | ICD-10-CM | POA: Diagnosis not present

## 2024-04-15 DIAGNOSIS — N951 Menopausal and female climacteric states: Secondary | ICD-10-CM | POA: Diagnosis not present

## 2024-04-15 DIAGNOSIS — R232 Flushing: Secondary | ICD-10-CM | POA: Diagnosis not present

## 2024-04-15 DIAGNOSIS — R102 Pelvic and perineal pain: Secondary | ICD-10-CM | POA: Diagnosis not present

## 2024-04-15 DIAGNOSIS — R61 Generalized hyperhidrosis: Secondary | ICD-10-CM | POA: Diagnosis not present

## 2024-04-15 DIAGNOSIS — N939 Abnormal uterine and vaginal bleeding, unspecified: Secondary | ICD-10-CM | POA: Diagnosis not present

## 2024-04-15 DIAGNOSIS — N399 Disorder of urinary system, unspecified: Secondary | ICD-10-CM | POA: Diagnosis not present

## 2024-04-17 NOTE — Progress Notes (Deleted)
 Office Visit Note  Patient: Savannah Beck             Date of Birth: [birth year]           MRN: [id number]             PCP: Kip Righter, MD Referring: Kip Righter, MD Visit Date: 05/01/2024   Subjective:  No chief complaint on file.   History of Present Illness: Savannah Beck is a 33 y.o. female here for follow up for ongoing symptoms with raynaud's temperature fluctuations, fatigue, brain fog, joint pains.    Previous HPI 01/11/2024 Savannah Beck is a 33 y.o. female here for follow up for ongoing symptoms with raynaud's temperature fluctuations, fatigue, brain fog, joint pains. She had ANA test repeated with PCP office last month slightly higher positive result 1:160. Other serum markers ESR and CRP were normal.  She has been noticing a lot of ongoing symptoms some have worsened in others just varying in intensity over time.  Notices a lot of hot and cold intolerance.  She cannot tolerate hot shower without getting frequently dizzy or notices significant swelling or discoloration in her feet.  She still having Raynaud's symptoms involving her hands not associated with any residual skin lesions.  Still remains severely fatigued.  Apparently had indeterminate test for OSA in 2022.     Previous HPI 03/22/21 Savannah Beck is a 33 y.o. female here for positive ANA with symptoms of raynaud's and fatigue with family history of undifferentiated connective tissue disease.  Symptoms are pretty much all new since her infection with COVID in January of this year.  Before this year she had chronic venous insufficiency of the legs with chronic lymphedema in the left leg after ablation therapy.  This was pretty severe with about 3 weeks of symptoms including dyspnea and chest pains but did not require hospitalization.  After recovering from this she continues to feel some shortness of breath with exertion but no other ongoing respiratory symptoms.  She does describe a period recently with about a  week of sharp chest pain on deep inspiration but did not change positionally.  She has noticed what she describes as Raynaud's symptoms with hand discoloration with cold exposure with pallor extending proximal to the MCP joints symptoms usually resolving within about 5 or 10 minutes after starting to rewarm.  She also describes easy bruising throughout her extremities with very minor traumas.  She notices stiffness and swelling in the proximal hand joints with some erythema first thing in the morning.  Stiffness and swelling improved but also has joint tenderness continuing throughout most of the days.     Labs reviewed 01/2021 ANA 1:80 speckled RF neg CCP neg ESR 6   No Rheumatology ROS completed.   PMFS History:  Patient Active Problem List   Diagnosis Date Noted   Positive ANA (antinuclear antibody) 03/22/2021   Arthralgia 03/22/2021   Raynaud phenomenon 02/03/2021   History of COVID-19 01/26/2021   Physical deconditioning 01/26/2021   Memory loss 01/26/2021   Irregular heart rate 01/26/2021   OSA (obstructive sleep apnea) 01/26/2021   SOB (shortness of breath) 12/03/2020   Palpitations 12/03/2020   Morbid obesity (HCC) 12/03/2020   Mixed hyperlipidemia 12/03/2020   Patellar malalignment syndrome, right 01/31/2012   ANKLE PAIN, RIGHT 07/01/2010   OTHER ENTHESOPATHY OF ANKLE AND TARSUS 07/01/2010    Past Medical History:  Diagnosis Date   Allergic rhinitis    Bilateral ovarian cysts  Hyperlipidemia    Long COVID    Raynaud's disease    post-Covid    Family History  Problem Relation Age of Onset   Thyroid  disease Mother    Diabetes Mother    Hypertension Mother    Cancer Father        Blood   Kidney cancer Father    Melanoma Father    Hypertension Father    Polycystic ovary syndrome Sister    Fibromyalgia Sister    Migraines Sister    Bipolar disorder Sister    Migraines Sister    Asthma Sister    Fibromyalgia Sister    Depression Brother    Anxiety  disorder Brother    Anxiety disorder Brother    Depression Brother    Autoimmune disease Brother    Multiple sclerosis Maternal Aunt    Breast cancer Paternal Grandmother        unknown   Breast cancer Cousin        22s   Diabetes Other    CAD Other    Alzheimer's disease Other    Asthma Other    Hypertension Other    Past Surgical History:  Procedure Laterality Date   ABLATION SAPHENOUS VEIN W/ RFA     twice in left leg   ANGIOMA CAUTERY Right    BREAST BIOPSY     LASER ABLATION OF VASCULAR LESION Left    x2   Social History   Social History Narrative   Not on file   Immunization History  Administered Date(s) Administered   Hepatitis B 03/30/2003   Influenza,inj,Quad PF,6+ Mos 05/09/2017, 06/04/2018, 07/04/2019   MMR 12/28/1998   PFIZER(Purple Top)SARS-COV-2 Vaccination 11/14/2019, 12/05/2019   Tdap 11/28/2011     Objective: Vital Signs: There were no vitals taken for this visit.   Physical Exam   Musculoskeletal Exam: ***  CDAI Exam: CDAI Score: -- Patient Global: --; Provider Global: -- Swollen: --; Tender: -- Joint Exam 05/01/2024   No joint exam has been documented for this visit   There is currently no information documented on the homunculus. Go to the Rheumatology activity and complete the homunculus joint exam.  Investigation: No additional findings.  Imaging: No results found.  Recent Labs: Lab Results  Component Value Date   WBC 9.0 08/15/2023   HGB 13.3 08/15/2023   PLT 257 08/15/2023   NA 137 08/15/2023   K 3.7 08/15/2023   CL 106 08/15/2023   CO2 24 08/15/2023   GLUCOSE 100 (H) 08/15/2023   BUN 14 08/15/2023   CREATININE 0.67 08/15/2023   BILITOT 0.6 08/15/2023   ALKPHOS 52 08/15/2023   AST 11 (L) 08/15/2023   ALT 11 08/15/2023   PROT 6.8 08/15/2023   ALBUMIN 4.3 08/15/2023   CALCIUM 8.7 (L) 08/15/2023   GFRAA >60 03/09/2020    Speciality Comments: No specialty comments available.  Procedures:  No procedures  performed Allergies: Dicyclomine  hcl, Hyoscyamine, Other, Pine, Amoxicillin, and Penicillins   Assessment / Plan:     Visit Diagnoses: No diagnosis found.  ***  Orders: No orders of the defined types were placed in this encounter.  No orders of the defined types were placed in this encounter.    Follow-Up Instructions: No follow-ups on file.   Talon Witting M Natanel Snavely, CMA  Note - This record has been created using Animal nutritionist.  Chart creation errors have been sought, but may not always  have been located. Such creation errors do not reflect on  the standard  of medical care.

## 2024-04-18 DIAGNOSIS — Z7409 Other reduced mobility: Secondary | ICD-10-CM | POA: Diagnosis not present

## 2024-04-18 DIAGNOSIS — Z20822 Contact with and (suspected) exposure to covid-19: Secondary | ICD-10-CM | POA: Diagnosis not present

## 2024-04-18 DIAGNOSIS — R2990 Unspecified symptoms and signs involving the nervous system: Secondary | ICD-10-CM | POA: Diagnosis not present

## 2024-04-18 DIAGNOSIS — G9332 Myalgic encephalomyelitis/chronic fatigue syndrome: Secondary | ICD-10-CM | POA: Diagnosis not present

## 2024-04-18 DIAGNOSIS — U099 Post covid-19 condition, unspecified: Secondary | ICD-10-CM | POA: Diagnosis not present

## 2024-04-19 DIAGNOSIS — F411 Generalized anxiety disorder: Secondary | ICD-10-CM | POA: Diagnosis not present

## 2024-04-26 ENCOUNTER — Telehealth: Payer: Self-pay

## 2024-04-26 DIAGNOSIS — F411 Generalized anxiety disorder: Secondary | ICD-10-CM | POA: Diagnosis not present

## 2024-04-26 NOTE — Telephone Encounter (Signed)
 Patient called stated dad tested postived for Lynch sydrome wants do discuss some things after seeing a genetic counselor so that's why she changed her appointment as well as long covid.

## 2024-05-01 ENCOUNTER — Ambulatory Visit: Admitting: Internal Medicine

## 2024-05-01 DIAGNOSIS — I7300 Raynaud's syndrome without gangrene: Secondary | ICD-10-CM

## 2024-05-01 DIAGNOSIS — R102 Pelvic and perineal pain: Secondary | ICD-10-CM | POA: Diagnosis not present

## 2024-05-01 DIAGNOSIS — M2550 Pain in unspecified joint: Secondary | ICD-10-CM

## 2024-05-01 DIAGNOSIS — R768 Other specified abnormal immunological findings in serum: Secondary | ICD-10-CM

## 2024-05-01 DIAGNOSIS — R002 Palpitations: Secondary | ICD-10-CM

## 2024-05-01 DIAGNOSIS — G4733 Obstructive sleep apnea (adult) (pediatric): Secondary | ICD-10-CM

## 2024-05-02 DIAGNOSIS — F411 Generalized anxiety disorder: Secondary | ICD-10-CM | POA: Diagnosis not present

## 2024-05-09 DIAGNOSIS — R195 Other fecal abnormalities: Secondary | ICD-10-CM | POA: Diagnosis not present

## 2024-05-09 DIAGNOSIS — F411 Generalized anxiety disorder: Secondary | ICD-10-CM | POA: Diagnosis not present

## 2024-05-09 DIAGNOSIS — R1010 Upper abdominal pain, unspecified: Secondary | ICD-10-CM | POA: Diagnosis not present

## 2024-05-09 DIAGNOSIS — R110 Nausea: Secondary | ICD-10-CM | POA: Diagnosis not present

## 2024-05-09 DIAGNOSIS — R109 Unspecified abdominal pain: Secondary | ICD-10-CM | POA: Diagnosis not present

## 2024-05-11 DIAGNOSIS — R41841 Cognitive communication deficit: Secondary | ICD-10-CM | POA: Diagnosis not present

## 2024-05-11 DIAGNOSIS — R600 Localized edema: Secondary | ICD-10-CM | POA: Diagnosis not present

## 2024-05-11 DIAGNOSIS — R29898 Other symptoms and signs involving the musculoskeletal system: Secondary | ICD-10-CM | POA: Diagnosis not present

## 2024-05-11 DIAGNOSIS — R2990 Unspecified symptoms and signs involving the nervous system: Secondary | ICD-10-CM | POA: Diagnosis not present

## 2024-05-11 DIAGNOSIS — U099 Post covid-19 condition, unspecified: Secondary | ICD-10-CM | POA: Diagnosis not present

## 2024-05-16 DIAGNOSIS — F411 Generalized anxiety disorder: Secondary | ICD-10-CM | POA: Diagnosis not present

## 2024-05-22 DIAGNOSIS — M6289 Other specified disorders of muscle: Secondary | ICD-10-CM | POA: Diagnosis not present

## 2024-05-22 DIAGNOSIS — R102 Pelvic and perineal pain: Secondary | ICD-10-CM | POA: Diagnosis not present

## 2024-05-22 DIAGNOSIS — M6281 Muscle weakness (generalized): Secondary | ICD-10-CM | POA: Diagnosis not present

## 2024-05-22 DIAGNOSIS — M62838 Other muscle spasm: Secondary | ICD-10-CM | POA: Diagnosis not present

## 2024-05-24 DIAGNOSIS — F411 Generalized anxiety disorder: Secondary | ICD-10-CM | POA: Diagnosis not present

## 2024-06-01 DIAGNOSIS — R600 Localized edema: Secondary | ICD-10-CM | POA: Diagnosis not present

## 2024-06-01 DIAGNOSIS — R41841 Cognitive communication deficit: Secondary | ICD-10-CM | POA: Diagnosis not present

## 2024-06-01 DIAGNOSIS — U099 Post covid-19 condition, unspecified: Secondary | ICD-10-CM | POA: Diagnosis not present

## 2024-06-01 DIAGNOSIS — R2990 Unspecified symptoms and signs involving the nervous system: Secondary | ICD-10-CM | POA: Diagnosis not present

## 2024-06-01 DIAGNOSIS — R29898 Other symptoms and signs involving the musculoskeletal system: Secondary | ICD-10-CM | POA: Diagnosis not present

## 2024-06-13 ENCOUNTER — Ambulatory Visit: Admitting: Internal Medicine

## 2024-06-13 DIAGNOSIS — Z1329 Encounter for screening for other suspected endocrine disorder: Secondary | ICD-10-CM | POA: Diagnosis not present

## 2024-06-13 DIAGNOSIS — Z01419 Encounter for gynecological examination (general) (routine) without abnormal findings: Secondary | ICD-10-CM | POA: Diagnosis not present

## 2024-06-13 DIAGNOSIS — Z130 Encounter for screening for diseases of the blood and blood-forming organs and certain disorders involving the immune mechanism: Secondary | ICD-10-CM | POA: Diagnosis not present

## 2024-06-13 DIAGNOSIS — Z6841 Body Mass Index (BMI) 40.0 and over, adult: Secondary | ICD-10-CM | POA: Diagnosis not present

## 2024-06-13 DIAGNOSIS — Z13228 Encounter for screening for other metabolic disorders: Secondary | ICD-10-CM | POA: Diagnosis not present

## 2024-06-13 DIAGNOSIS — Z131 Encounter for screening for diabetes mellitus: Secondary | ICD-10-CM | POA: Diagnosis not present

## 2024-06-15 DIAGNOSIS — R41841 Cognitive communication deficit: Secondary | ICD-10-CM | POA: Diagnosis not present

## 2024-06-15 DIAGNOSIS — U099 Post covid-19 condition, unspecified: Secondary | ICD-10-CM | POA: Diagnosis not present

## 2024-06-15 DIAGNOSIS — R29898 Other symptoms and signs involving the musculoskeletal system: Secondary | ICD-10-CM | POA: Diagnosis not present

## 2024-06-15 DIAGNOSIS — R600 Localized edema: Secondary | ICD-10-CM | POA: Diagnosis not present

## 2024-06-15 DIAGNOSIS — R2990 Unspecified symptoms and signs involving the nervous system: Secondary | ICD-10-CM | POA: Diagnosis not present

## 2024-06-19 DIAGNOSIS — F411 Generalized anxiety disorder: Secondary | ICD-10-CM | POA: Diagnosis not present

## 2024-06-20 DIAGNOSIS — D4709 Other mast cell neoplasms of uncertain behavior: Secondary | ICD-10-CM | POA: Diagnosis not present

## 2024-06-20 DIAGNOSIS — U099 Post covid-19 condition, unspecified: Secondary | ICD-10-CM | POA: Diagnosis not present

## 2024-06-28 DIAGNOSIS — R2990 Unspecified symptoms and signs involving the nervous system: Secondary | ICD-10-CM | POA: Diagnosis not present

## 2024-06-28 DIAGNOSIS — R29898 Other symptoms and signs involving the musculoskeletal system: Secondary | ICD-10-CM | POA: Diagnosis not present

## 2024-06-28 DIAGNOSIS — U099 Post covid-19 condition, unspecified: Secondary | ICD-10-CM | POA: Diagnosis not present

## 2024-06-28 DIAGNOSIS — R41841 Cognitive communication deficit: Secondary | ICD-10-CM | POA: Diagnosis not present

## 2024-06-28 DIAGNOSIS — R600 Localized edema: Secondary | ICD-10-CM | POA: Diagnosis not present

## 2024-06-29 DIAGNOSIS — R2990 Unspecified symptoms and signs involving the nervous system: Secondary | ICD-10-CM | POA: Diagnosis not present

## 2024-06-29 DIAGNOSIS — R41841 Cognitive communication deficit: Secondary | ICD-10-CM | POA: Diagnosis not present

## 2024-06-29 DIAGNOSIS — U099 Post covid-19 condition, unspecified: Secondary | ICD-10-CM | POA: Diagnosis not present

## 2024-06-29 DIAGNOSIS — R29898 Other symptoms and signs involving the musculoskeletal system: Secondary | ICD-10-CM | POA: Diagnosis not present

## 2024-06-29 DIAGNOSIS — R600 Localized edema: Secondary | ICD-10-CM | POA: Diagnosis not present

## 2024-08-02 DIAGNOSIS — F411 Generalized anxiety disorder: Secondary | ICD-10-CM | POA: Diagnosis not present

## 2024-08-02 NOTE — Progress Notes (Deleted)
 Office Visit Note  Patient: Savannah Beck             Date of Birth: 1991/03/25           MRN: [id number]             PCP: Kip Righter, MD Referring: Kip Righter, MD Visit Date: 08/06/2024   Subjective:  No chief complaint on file.   History of Present Illness: Savannah Beck is a 33 y.o. female here for follow up for ongoing symptoms with raynaud's temperature fluctuations, fatigue, brain fog, joint pains.    Previous HPI 01/11/2023 Savannah Beck is a 33 y.o. female here for follow up for ongoing symptoms with raynaud's temperature fluctuations, fatigue, brain fog, joint pains. She had ANA test repeated with PCP office last month slightly higher positive result 1:160. Other serum markers ESR and CRP were normal.  She has been noticing a lot of ongoing symptoms some have worsened in others just varying in intensity over time.  Notices a lot of hot and cold intolerance.  She cannot tolerate hot shower without getting frequently dizzy or notices significant swelling or discoloration in her feet.  She still having Raynaud's symptoms involving her hands not associated with any residual skin lesions.  Still remains severely fatigued.  Apparently had indeterminate test for OSA in 2022.     Previous HPI 03/22/21 Savannah Beck is a 32 y.o. female here for positive ANA with symptoms of raynaud's and fatigue with family history of undifferentiated connective tissue disease.  Symptoms are pretty much all new since her infection with COVID in January of this year.  Before this year she had chronic venous insufficiency of the legs with chronic lymphedema in the left leg after ablation therapy.  This was pretty severe with about 3 weeks of symptoms including dyspnea and chest pains but did not require hospitalization.  After recovering from this she continues to feel some shortness of breath with exertion but no other ongoing respiratory symptoms.  She does describe a period recently with about a  week of sharp chest pain on deep inspiration but did not change positionally.  She has noticed what she describes as Raynaud's symptoms with hand discoloration with cold exposure with pallor extending proximal to the MCP joints symptoms usually resolving within about 5 or 10 minutes after starting to rewarm.  She also describes easy bruising throughout her extremities with very minor traumas.  She notices stiffness and swelling in the proximal hand joints with some erythema first thing in the morning.  Stiffness and swelling improved but also has joint tenderness continuing throughout most of the days.     Labs reviewed 01/2021 ANA 1:80 speckled RF neg CCP neg ESR 6   No Rheumatology ROS completed.   PMFS History:  Patient Active Problem List   Diagnosis Date Noted   Positive ANA (antinuclear antibody) 03/22/2021   Arthralgia 03/22/2021   Raynaud phenomenon 02/03/2021   History of COVID-19 01/26/2021   Physical deconditioning 01/26/2021   Memory loss 01/26/2021   Irregular heart rate 01/26/2021   OSA (obstructive sleep apnea) 01/26/2021   SOB (shortness of breath) 12/03/2020   Palpitations 12/03/2020   Morbid obesity (HCC) 12/03/2020   Mixed hyperlipidemia 12/03/2020   Patellar malalignment syndrome, right 01/31/2012   ANKLE PAIN, RIGHT 07/01/2010   OTHER ENTHESOPATHY OF ANKLE AND TARSUS 07/01/2010    Past Medical History:  Diagnosis Date   Allergic rhinitis    Bilateral ovarian cysts  Hyperlipidemia    Long COVID    Raynaud's disease    post-Covid    Family History  Problem Relation Age of Onset   Thyroid  disease Mother    Diabetes Mother    Hypertension Mother    Cancer Father        Blood   Kidney cancer Father    Melanoma Father    Hypertension Father    Polycystic ovary syndrome Sister    Fibromyalgia Sister    Migraines Sister    Bipolar disorder Sister    Migraines Sister    Asthma Sister    Fibromyalgia Sister    Depression Brother    Anxiety  disorder Brother    Anxiety disorder Brother    Depression Brother    Autoimmune disease Brother    Multiple sclerosis Maternal Aunt    Breast cancer Paternal Grandmother        unknown   Breast cancer Cousin        38s   Diabetes Other    CAD Other    Alzheimer's disease Other    Asthma Other    Hypertension Other    Past Surgical History:  Procedure Laterality Date   ABLATION SAPHENOUS VEIN W/ RFA     twice in left leg   ANGIOMA CAUTERY Right    BREAST BIOPSY     LASER ABLATION OF VASCULAR LESION Left    x2   Social History   Social History Narrative   Not on file   Immunization History  Administered Date(s) Administered   Hepatitis B 03/30/2003   Influenza,inj,Quad PF,6+ Mos 05/09/2017, 06/04/2018, 07/04/2019   MMR 12/28/1998   PFIZER(Purple Top)SARS-COV-2 Vaccination 11/14/2019, 12/05/2019   Tdap 11/28/2011     Objective: Vital Signs: There were no vitals taken for this visit.   Physical Exam   Musculoskeletal Exam: ***  CDAI Exam: CDAI Score: -- Patient Global: --; Provider Global: -- Swollen: --; Tender: -- Joint Exam 08/06/2024   No joint exam has been documented for this visit   There is currently no information documented on the homunculus. Go to the Rheumatology activity and complete the homunculus joint exam.  Investigation: No additional findings.  Imaging: No results found.  Recent Labs: Lab Results  Component Value Date   WBC 9.0 08/15/2023   HGB 13.3 08/15/2023   PLT 257 08/15/2023   NA 137 08/15/2023   K 3.7 08/15/2023   CL 106 08/15/2023   CO2 24 08/15/2023   GLUCOSE 100 (H) 08/15/2023   BUN 14 08/15/2023   CREATININE 0.67 08/15/2023   BILITOT 0.6 08/15/2023   ALKPHOS 52 08/15/2023   AST 11 (L) 08/15/2023   ALT 11 08/15/2023   PROT 6.8 08/15/2023   ALBUMIN 4.3 08/15/2023   CALCIUM 8.7 (L) 08/15/2023   GFRAA >60 03/09/2020    Speciality Comments: No specialty comments available.  Procedures:  No procedures  performed Allergies: Dicyclomine  hcl, Hyoscyamine, Other, Pine, Amoxicillin, and Penicillins   Assessment / Plan:     Visit Diagnoses: No diagnosis found.  ***  Orders: No orders of the defined types were placed in this encounter.  No orders of the defined types were placed in this encounter.    Follow-Up Instructions: No follow-ups on file.   Marium Ragan M Kalifa Cadden, CMA  Note - This record has been created using Animal nutritionist.  Chart creation errors have been sought, but may not always  have been located. Such creation errors do not reflect on  the standard  of medical care.

## 2024-08-03 DIAGNOSIS — U099 Post covid-19 condition, unspecified: Secondary | ICD-10-CM | POA: Diagnosis not present

## 2024-08-03 DIAGNOSIS — R41841 Cognitive communication deficit: Secondary | ICD-10-CM | POA: Diagnosis not present

## 2024-08-03 DIAGNOSIS — R2990 Unspecified symptoms and signs involving the nervous system: Secondary | ICD-10-CM | POA: Diagnosis not present

## 2024-08-03 DIAGNOSIS — R29898 Other symptoms and signs involving the musculoskeletal system: Secondary | ICD-10-CM | POA: Diagnosis not present

## 2024-08-03 DIAGNOSIS — R600 Localized edema: Secondary | ICD-10-CM | POA: Diagnosis not present

## 2024-08-06 ENCOUNTER — Ambulatory Visit: Admitting: Internal Medicine

## 2024-08-06 DIAGNOSIS — M2550 Pain in unspecified joint: Secondary | ICD-10-CM

## 2024-08-06 DIAGNOSIS — R002 Palpitations: Secondary | ICD-10-CM

## 2024-08-06 DIAGNOSIS — I7300 Raynaud's syndrome without gangrene: Secondary | ICD-10-CM

## 2024-08-06 DIAGNOSIS — R7689 Other specified abnormal immunological findings in serum: Secondary | ICD-10-CM

## 2024-08-06 DIAGNOSIS — G4733 Obstructive sleep apnea (adult) (pediatric): Secondary | ICD-10-CM

## 2024-08-08 DIAGNOSIS — F411 Generalized anxiety disorder: Secondary | ICD-10-CM | POA: Diagnosis not present

## 2024-08-08 DIAGNOSIS — F4310 Post-traumatic stress disorder, unspecified: Secondary | ICD-10-CM | POA: Diagnosis not present

## 2024-08-08 DIAGNOSIS — F321 Major depressive disorder, single episode, moderate: Secondary | ICD-10-CM | POA: Diagnosis not present

## 2024-08-09 DIAGNOSIS — F411 Generalized anxiety disorder: Secondary | ICD-10-CM | POA: Diagnosis not present

## 2024-08-12 DIAGNOSIS — D1801 Hemangioma of skin and subcutaneous tissue: Secondary | ICD-10-CM | POA: Diagnosis not present

## 2024-08-12 DIAGNOSIS — L858 Other specified epidermal thickening: Secondary | ICD-10-CM | POA: Diagnosis not present

## 2024-08-12 DIAGNOSIS — D2262 Melanocytic nevi of left upper limb, including shoulder: Secondary | ICD-10-CM | POA: Diagnosis not present

## 2024-08-12 DIAGNOSIS — D225 Melanocytic nevi of trunk: Secondary | ICD-10-CM | POA: Diagnosis not present

## 2024-08-12 DIAGNOSIS — D485 Neoplasm of uncertain behavior of skin: Secondary | ICD-10-CM | POA: Diagnosis not present

## 2024-08-16 DIAGNOSIS — F411 Generalized anxiety disorder: Secondary | ICD-10-CM | POA: Diagnosis not present

## 2024-08-20 DIAGNOSIS — Z7409 Other reduced mobility: Secondary | ICD-10-CM | POA: Diagnosis not present

## 2024-08-20 DIAGNOSIS — R2990 Unspecified symptoms and signs involving the nervous system: Secondary | ICD-10-CM | POA: Diagnosis not present

## 2024-08-20 DIAGNOSIS — R400 Somnolence: Secondary | ICD-10-CM | POA: Diagnosis not present

## 2024-08-20 DIAGNOSIS — G9332 Myalgic encephalomyelitis/chronic fatigue syndrome: Secondary | ICD-10-CM | POA: Diagnosis not present

## 2024-08-23 DIAGNOSIS — M47812 Spondylosis without myelopathy or radiculopathy, cervical region: Secondary | ICD-10-CM | POA: Diagnosis not present

## 2024-08-23 DIAGNOSIS — M50223 Other cervical disc displacement at C6-C7 level: Secondary | ICD-10-CM | POA: Diagnosis not present

## 2024-08-23 DIAGNOSIS — M4802 Spinal stenosis, cervical region: Secondary | ICD-10-CM | POA: Diagnosis not present

## 2024-08-23 DIAGNOSIS — G629 Polyneuropathy, unspecified: Secondary | ICD-10-CM | POA: Diagnosis not present

## 2024-08-26 DIAGNOSIS — F4310 Post-traumatic stress disorder, unspecified: Secondary | ICD-10-CM | POA: Diagnosis not present

## 2024-08-26 DIAGNOSIS — F321 Major depressive disorder, single episode, moderate: Secondary | ICD-10-CM | POA: Diagnosis not present

## 2024-08-26 DIAGNOSIS — F411 Generalized anxiety disorder: Secondary | ICD-10-CM | POA: Diagnosis not present

## 2024-09-25 NOTE — Assessment & Plan Note (Signed)
 Savannah Beck

## 2024-09-25 NOTE — Progress Notes (Unsigned)
 "  Office Visit Note  Patient: Savannah Beck             Date of Birth: 1990-10-10           MRN: [id number]             PCP: Kip Righter, MD Referring: Kip Righter, MD Visit Date: 10/07/2024   Subjective:  No chief complaint on file.   History of Present Illness: Savannah Beck is a 34 y.o. female here for follow up for ongoing symptoms with raynaud's temperature fluctuations, fatigue, brain fog, joint pains.   Previous HPI 01/11/2023  Savannah Beck is a 34 y.o. female here for follow up for ongoing symptoms with raynaud's temperature fluctuations, fatigue, brain fog, joint pains. She had ANA test repeated with PCP office last month slightly higher positive result 1:160. Other serum markers ESR and CRP were normal.  She has been noticing a lot of ongoing symptoms some have worsened in others just varying in intensity over time.  Notices a lot of hot and cold intolerance.  She cannot tolerate hot shower without getting frequently dizzy or notices significant swelling or discoloration in her feet.  She still having Raynaud's symptoms involving her hands not associated with any residual skin lesions.  Still remains severely fatigued.  Apparently had indeterminate test for OSA in 2022.     Previous HPI 03/22/21 Savannah Beck is a 34 y.o. female here for positive ANA with symptoms of raynaud's and fatigue with family history of undifferentiated connective tissue disease.  Symptoms are pretty much all new since her infection with COVID in January of this year.  Before this year she had chronic venous insufficiency of the legs with chronic lymphedema in the left leg after ablation therapy.  This was pretty severe with about 3 weeks of symptoms including dyspnea and chest pains but did not require hospitalization.  After recovering from this she continues to feel some shortness of breath with exertion but no other ongoing respiratory symptoms.  She does describe a period recently with about a  week of sharp chest pain on deep inspiration but did not change positionally.  She has noticed what she describes as Raynaud's symptoms with hand discoloration with cold exposure with pallor extending proximal to the MCP joints symptoms usually resolving within about 5 or 10 minutes after starting to rewarm.  She also describes easy bruising throughout her extremities with very minor traumas.  She notices stiffness and swelling in the proximal hand joints with some erythema first thing in the morning.  Stiffness and swelling improved but also has joint tenderness continuing throughout most of the days.     Labs reviewed 01/2021 ANA 1:80 speckled RF neg CCP neg ESR 6   No Rheumatology ROS completed.   PMFS History:  Patient Active Problem List   Diagnosis Date Noted   Positive ANA (antinuclear antibody) 03/22/2021   Arthralgia 03/22/2021   Raynaud phenomenon 02/03/2021   History of COVID-19 01/26/2021   Physical deconditioning 01/26/2021   Memory loss 01/26/2021   Irregular heart rate 01/26/2021   OSA (obstructive sleep apnea) 01/26/2021   SOB (shortness of breath) 12/03/2020   Palpitations 12/03/2020   Morbid obesity (HCC) 12/03/2020   Mixed hyperlipidemia 12/03/2020   Patellar malalignment syndrome, right 01/31/2012   ANKLE PAIN, RIGHT 07/01/2010   OTHER ENTHESOPATHY OF ANKLE AND TARSUS 07/01/2010    Past Medical History:  Diagnosis Date   Allergic rhinitis    Bilateral ovarian cysts  Hyperlipidemia    Long COVID    Raynaud's disease    post-Covid    Family History  Problem Relation Age of Onset   Thyroid  disease Mother    Diabetes Mother    Hypertension Mother    Cancer Father        Blood   Kidney cancer Father    Melanoma Father    Hypertension Father    Polycystic ovary syndrome Sister    Fibromyalgia Sister    Migraines Sister    Bipolar disorder Sister    Migraines Sister    Asthma Sister    Fibromyalgia Sister    Depression Brother    Anxiety  disorder Brother    Anxiety disorder Brother    Depression Brother    Autoimmune disease Brother    Multiple sclerosis Maternal Aunt    Breast cancer Paternal Grandmother        unknown   Breast cancer Cousin        9s   Diabetes Other    CAD Other    Alzheimer's disease Other    Asthma Other    Hypertension Other    Past Surgical History:  Procedure Laterality Date   ABLATION SAPHENOUS VEIN W/ RFA     twice in left leg   ANGIOMA CAUTERY Right    BREAST BIOPSY     LASER ABLATION OF VASCULAR LESION Left    x2   Social History   Social History Narrative   Not on file   Immunization History  Administered Date(s) Administered   Hepatitis B 03/30/2003   Influenza,inj,Quad PF,6+ Mos 05/09/2017, 06/04/2018, 07/04/2019   MMR 12/28/1998   PFIZER(Purple Top)SARS-COV-2 Vaccination 11/14/2019, 12/05/2019   Tdap 11/28/2011     Objective: Vital Signs: There were no vitals taken for this visit.   Physical Exam   Musculoskeletal Exam: ***   Investigation: No additional findings.  Imaging: No results found.  Recent Labs: Lab Results  Component Value Date   WBC 9.0 08/15/2023   HGB 13.3 08/15/2023   PLT 257 08/15/2023   NA 137 08/15/2023   K 3.7 08/15/2023   CL 106 08/15/2023   CO2 24 08/15/2023   GLUCOSE 100 (H) 08/15/2023   BUN 14 08/15/2023   CREATININE 0.67 08/15/2023   BILITOT 0.6 08/15/2023   ALKPHOS 52 08/15/2023   AST 11 (L) 08/15/2023   ALT 11 08/15/2023   PROT 6.8 08/15/2023   ALBUMIN 4.3 08/15/2023   CALCIUM 8.7 (L) 08/15/2023   GFRAA >60 03/09/2020    Speciality Comments: No specialty comments available.  Procedures:  No procedures performed Allergies: Dicyclomine  hcl, Hyoscyamine, Other, Pine, Amoxicillin, and Penicillins   Assessment / Plan:     Visit Diagnoses:  Assessment & Plan Positive ANA (antinuclear antibody)     Raynaud's phenomenon without gangrene     Palpitations     OSA (obstructive sleep apnea)      Arthralgia, unspecified joint      ***  Follow-Up Instructions: No follow-ups on file.   Shemaiah Round M Rufina Kimery, CMA  Note - This record has been created using Animal nutritionist.  Chart creation errors have been sought, but may not always  have been located. Such creation errors do not reflect on  the standard of medical care. "

## 2024-10-07 ENCOUNTER — Ambulatory Visit: Admitting: Internal Medicine

## 2024-10-07 DIAGNOSIS — G4733 Obstructive sleep apnea (adult) (pediatric): Secondary | ICD-10-CM

## 2024-10-07 DIAGNOSIS — M2550 Pain in unspecified joint: Secondary | ICD-10-CM

## 2024-10-07 DIAGNOSIS — I7300 Raynaud's syndrome without gangrene: Secondary | ICD-10-CM

## 2024-10-07 DIAGNOSIS — R002 Palpitations: Secondary | ICD-10-CM

## 2024-10-07 DIAGNOSIS — R7689 Other specified abnormal immunological findings in serum: Secondary | ICD-10-CM
